# Patient Record
Sex: Female | Born: 1970 | Race: Black or African American | Hispanic: No | Marital: Married | Smoking: Never smoker
Health system: Southern US, Community
[De-identification: ages and names within clinical notes are randomized; demographics above are authoritative.]

## PROBLEM LIST (undated history)

## (undated) DIAGNOSIS — I499 Cardiac arrhythmia, unspecified: Secondary | ICD-10-CM

## (undated) DIAGNOSIS — E669 Obesity, unspecified: Secondary | ICD-10-CM

## (undated) DIAGNOSIS — R011 Cardiac murmur, unspecified: Secondary | ICD-10-CM

## (undated) DIAGNOSIS — D259 Leiomyoma of uterus, unspecified: Secondary | ICD-10-CM

## (undated) DIAGNOSIS — F419 Anxiety disorder, unspecified: Secondary | ICD-10-CM

## (undated) DIAGNOSIS — O09219 Supervision of pregnancy with history of pre-term labor, unspecified trimester: Secondary | ICD-10-CM

## (undated) DIAGNOSIS — M1990 Unspecified osteoarthritis, unspecified site: Secondary | ICD-10-CM

## (undated) DIAGNOSIS — K219 Gastro-esophageal reflux disease without esophagitis: Secondary | ICD-10-CM

## (undated) DIAGNOSIS — J329 Chronic sinusitis, unspecified: Principal | ICD-10-CM

## (undated) DIAGNOSIS — O09529 Supervision of elderly multigravida, unspecified trimester: Secondary | ICD-10-CM

## (undated) DIAGNOSIS — M461 Sacroiliitis, not elsewhere classified: Secondary | ICD-10-CM

## (undated) DIAGNOSIS — Z8619 Personal history of other infectious and parasitic diseases: Secondary | ICD-10-CM

## (undated) DIAGNOSIS — R12 Heartburn: Secondary | ICD-10-CM

## (undated) DIAGNOSIS — N83209 Unspecified ovarian cyst, unspecified side: Secondary | ICD-10-CM

## (undated) DIAGNOSIS — R51 Headache: Secondary | ICD-10-CM

## (undated) DIAGNOSIS — F909 Attention-deficit hyperactivity disorder, unspecified type: Secondary | ICD-10-CM

## (undated) DIAGNOSIS — M069 Rheumatoid arthritis, unspecified: Secondary | ICD-10-CM

## (undated) DIAGNOSIS — O210 Mild hyperemesis gravidarum: Secondary | ICD-10-CM

## (undated) HISTORY — DX: Obesity, unspecified: E66.9

## (undated) HISTORY — DX: Supervision of pregnancy with history of pre-term labor, unspecified trimester: O09.219

## (undated) HISTORY — DX: Leiomyoma of uterus, unspecified: D25.9

## (undated) HISTORY — DX: Headache: R51

## (undated) HISTORY — DX: Chronic sinusitis, unspecified: J32.9

## (undated) HISTORY — DX: Supervision of elderly multigravida, unspecified trimester: O09.529

## (undated) HISTORY — PX: OTHER SURGICAL HISTORY: SHX169

## (undated) HISTORY — PX: TONSILLECTOMY AND ADENOIDECTOMY: SHX28

## (undated) HISTORY — DX: Rheumatoid arthritis, unspecified: M06.9

## (undated) HISTORY — DX: Heartburn: R12

## (undated) HISTORY — PX: TONSILLECTOMY: SUR1361

## (undated) HISTORY — PX: LAPAROSCOPY: SHX197

## (undated) HISTORY — DX: Personal history of other infectious and parasitic diseases: Z86.19

## (undated) HISTORY — DX: Sacroiliitis, not elsewhere classified: M46.1

## (undated) HISTORY — DX: Cardiac arrhythmia, unspecified: I49.9

---

## 1997-12-29 ENCOUNTER — Emergency Department (HOSPITAL_COMMUNITY): Admission: EM | Admit: 1997-12-29 | Discharge: 1997-12-29 | Payer: Self-pay | Admitting: Emergency Medicine

## 1998-01-13 ENCOUNTER — Inpatient Hospital Stay (HOSPITAL_COMMUNITY): Admission: AD | Admit: 1998-01-13 | Discharge: 1998-01-13 | Payer: Self-pay | Admitting: Obstetrics and Gynecology

## 1999-01-06 ENCOUNTER — Encounter: Payer: Self-pay | Admitting: Emergency Medicine

## 1999-01-06 ENCOUNTER — Emergency Department (HOSPITAL_COMMUNITY): Admission: EM | Admit: 1999-01-06 | Discharge: 1999-01-06 | Payer: Self-pay | Admitting: Emergency Medicine

## 1999-03-25 ENCOUNTER — Other Ambulatory Visit: Admission: RE | Admit: 1999-03-25 | Discharge: 1999-03-25 | Payer: Self-pay | Admitting: Obstetrics & Gynecology

## 1999-04-23 ENCOUNTER — Emergency Department (HOSPITAL_COMMUNITY): Admission: EM | Admit: 1999-04-23 | Discharge: 1999-04-23 | Payer: Self-pay | Admitting: Emergency Medicine

## 1999-04-23 ENCOUNTER — Encounter: Payer: Self-pay | Admitting: Emergency Medicine

## 1999-05-31 ENCOUNTER — Emergency Department (HOSPITAL_COMMUNITY): Admission: EM | Admit: 1999-05-31 | Discharge: 1999-05-31 | Payer: Self-pay | Admitting: Emergency Medicine

## 1999-06-06 ENCOUNTER — Emergency Department (HOSPITAL_COMMUNITY): Admission: EM | Admit: 1999-06-06 | Discharge: 1999-06-06 | Payer: Self-pay | Admitting: Emergency Medicine

## 1999-07-21 ENCOUNTER — Encounter: Payer: Self-pay | Admitting: Obstetrics and Gynecology

## 1999-07-21 ENCOUNTER — Ambulatory Visit (HOSPITAL_COMMUNITY): Admission: RE | Admit: 1999-07-21 | Discharge: 1999-07-21 | Payer: Self-pay | Admitting: Obstetrics and Gynecology

## 2002-12-25 ENCOUNTER — Inpatient Hospital Stay (HOSPITAL_COMMUNITY): Admission: AD | Admit: 2002-12-25 | Discharge: 2003-01-01 | Payer: Self-pay | Admitting: Obstetrics & Gynecology

## 2002-12-26 ENCOUNTER — Encounter: Payer: Self-pay | Admitting: *Deleted

## 2003-01-08 ENCOUNTER — Encounter: Admission: RE | Admit: 2003-01-08 | Discharge: 2003-01-08 | Payer: Self-pay | Admitting: *Deleted

## 2003-01-15 ENCOUNTER — Encounter: Admission: RE | Admit: 2003-01-15 | Discharge: 2003-01-15 | Payer: Self-pay | Admitting: Family Medicine

## 2003-01-15 ENCOUNTER — Inpatient Hospital Stay (HOSPITAL_COMMUNITY): Admission: AD | Admit: 2003-01-15 | Discharge: 2003-01-15 | Payer: Self-pay | Admitting: Obstetrics & Gynecology

## 2003-01-29 ENCOUNTER — Encounter: Admission: RE | Admit: 2003-01-29 | Discharge: 2003-01-29 | Payer: Self-pay | Admitting: Family Medicine

## 2003-01-29 ENCOUNTER — Inpatient Hospital Stay (HOSPITAL_COMMUNITY): Admission: AD | Admit: 2003-01-29 | Discharge: 2003-01-29 | Payer: Self-pay | Admitting: Obstetrics & Gynecology

## 2003-02-12 ENCOUNTER — Encounter: Admission: RE | Admit: 2003-02-12 | Discharge: 2003-02-12 | Payer: Self-pay | Admitting: *Deleted

## 2003-02-26 ENCOUNTER — Encounter: Admission: RE | Admit: 2003-02-26 | Discharge: 2003-02-26 | Payer: Self-pay | Admitting: Family Medicine

## 2003-03-12 ENCOUNTER — Inpatient Hospital Stay (HOSPITAL_COMMUNITY): Admission: AD | Admit: 2003-03-12 | Discharge: 2003-03-19 | Payer: Self-pay | Admitting: Obstetrics & Gynecology

## 2003-03-12 ENCOUNTER — Encounter: Admission: RE | Admit: 2003-03-12 | Discharge: 2003-03-12 | Payer: Self-pay | Admitting: *Deleted

## 2003-03-12 ENCOUNTER — Encounter: Payer: Self-pay | Admitting: Obstetrics & Gynecology

## 2003-03-15 ENCOUNTER — Encounter: Payer: Self-pay | Admitting: Family Medicine

## 2003-03-16 ENCOUNTER — Encounter: Payer: Self-pay | Admitting: *Deleted

## 2003-03-17 ENCOUNTER — Encounter: Payer: Self-pay | Admitting: Cardiology

## 2003-03-23 ENCOUNTER — Observation Stay (HOSPITAL_COMMUNITY): Admission: AD | Admit: 2003-03-23 | Discharge: 2003-03-24 | Payer: Self-pay | Admitting: Obstetrics & Gynecology

## 2004-12-19 ENCOUNTER — Inpatient Hospital Stay (HOSPITAL_COMMUNITY): Admission: AD | Admit: 2004-12-19 | Discharge: 2004-12-19 | Payer: Self-pay | Admitting: Obstetrics and Gynecology

## 2004-12-21 ENCOUNTER — Inpatient Hospital Stay (HOSPITAL_COMMUNITY): Admission: AD | Admit: 2004-12-21 | Discharge: 2004-12-23 | Payer: Self-pay | Admitting: Obstetrics and Gynecology

## 2004-12-21 ENCOUNTER — Encounter (INDEPENDENT_AMBULATORY_CARE_PROVIDER_SITE_OTHER): Payer: Self-pay | Admitting: *Deleted

## 2004-12-24 ENCOUNTER — Encounter: Admission: RE | Admit: 2004-12-24 | Discharge: 2005-01-20 | Payer: Self-pay | Admitting: Obstetrics and Gynecology

## 2007-06-08 ENCOUNTER — Inpatient Hospital Stay (HOSPITAL_COMMUNITY): Admission: AD | Admit: 2007-06-08 | Discharge: 2007-06-09 | Payer: Self-pay | Admitting: Obstetrics

## 2007-11-08 ENCOUNTER — Ambulatory Visit (HOSPITAL_COMMUNITY): Admission: RE | Admit: 2007-11-08 | Discharge: 2007-11-08 | Payer: Self-pay | Admitting: Gastroenterology

## 2007-12-31 LAB — CONVERTED CEMR LAB: Pap Smear: NORMAL

## 2008-02-03 ENCOUNTER — Ambulatory Visit (HOSPITAL_COMMUNITY): Admission: RE | Admit: 2008-02-03 | Discharge: 2008-02-03 | Payer: Self-pay | Admitting: Obstetrics and Gynecology

## 2008-07-16 ENCOUNTER — Ambulatory Visit: Payer: Self-pay | Admitting: Internal Medicine

## 2008-07-16 ENCOUNTER — Ambulatory Visit: Payer: Self-pay | Admitting: Diagnostic Radiology

## 2008-07-16 ENCOUNTER — Ambulatory Visit (HOSPITAL_BASED_OUTPATIENT_CLINIC_OR_DEPARTMENT_OTHER): Admission: RE | Admit: 2008-07-16 | Discharge: 2008-07-16 | Payer: Self-pay | Admitting: Internal Medicine

## 2008-07-16 ENCOUNTER — Telehealth: Payer: Self-pay | Admitting: Internal Medicine

## 2008-07-16 DIAGNOSIS — Z8719 Personal history of other diseases of the digestive system: Secondary | ICD-10-CM | POA: Insufficient documentation

## 2008-07-16 DIAGNOSIS — M25569 Pain in unspecified knee: Secondary | ICD-10-CM | POA: Insufficient documentation

## 2008-07-17 ENCOUNTER — Ambulatory Visit: Payer: Self-pay | Admitting: Internal Medicine

## 2008-07-17 LAB — CONVERTED CEMR LAB
ALT: 17 units/L (ref 0–35)
AST: 17 units/L (ref 0–37)
Alkaline Phosphatase: 48 units/L (ref 39–117)
BUN: 14 mg/dL (ref 6–23)
Bilirubin Urine: NEGATIVE
Bilirubin, Direct: 0.1 mg/dL (ref 0.0–0.3)
Calcium: 9.3 mg/dL (ref 8.4–10.5)
Cholesterol: 189 mg/dL (ref 0–200)
Eosinophils Absolute: 0.1 10*3/uL (ref 0.0–0.7)
GFR calc Af Amer: 105 mL/min
GFR calc non Af Amer: 87 mL/min
Glucose, Bld: 84 mg/dL (ref 70–99)
HCT: 37.4 % (ref 36.0–46.0)
HDL: 73.2 mg/dL (ref >39.0)
Hemoglobin: 13 g/dL (ref 12.0–15.0)
Ketones, ur: NEGATIVE mg/dL
MCV: 95.3 fL (ref 78.0–100.0)
Monocytes Absolute: 0.6 10*3/uL (ref 0.1–1.0)
Monocytes Relative: 6.8 % (ref 3.0–12.0)
Neutro Abs: 5.7 10*3/uL (ref 1.4–7.7)
Nitrite: NEGATIVE
RDW: 12.5 % (ref 11.5–14.6)
Sodium: 140 meq/L (ref 135–145)
TSH: 1.1 microintl units/mL (ref 0.35–5.50)
Total Protein, Urine: NEGATIVE mg/dL
Triglycerides: 45 mg/dL (ref 0–149)
Urine Glucose: NEGATIVE mg/dL
VLDL: 9 mg/dL (ref 0–40)
pH: 5.5 (ref 5.0–8.0)

## 2008-07-20 ENCOUNTER — Encounter: Payer: Self-pay | Admitting: Internal Medicine

## 2010-01-25 ENCOUNTER — Ambulatory Visit: Payer: Self-pay | Admitting: Internal Medicine

## 2010-01-25 ENCOUNTER — Telehealth: Payer: Self-pay | Admitting: Internal Medicine

## 2010-01-25 DIAGNOSIS — M549 Dorsalgia, unspecified: Secondary | ICD-10-CM | POA: Insufficient documentation

## 2010-01-25 LAB — CONVERTED CEMR LAB
Blood in Urine, dipstick: NEGATIVE
Ketones, urine, test strip: NEGATIVE
Nitrite: NEGATIVE
Protein, U semiquant: NEGATIVE
Urobilinogen, UA: 0.2
WBC Urine, dipstick: NEGATIVE

## 2010-03-29 LAB — CONVERTED CEMR LAB: Pap Smear: NORMAL

## 2010-06-01 ENCOUNTER — Ambulatory Visit (HOSPITAL_BASED_OUTPATIENT_CLINIC_OR_DEPARTMENT_OTHER): Admission: RE | Admit: 2010-06-01 | Discharge: 2010-06-01 | Payer: Self-pay | Admitting: Internal Medicine

## 2010-06-01 ENCOUNTER — Ambulatory Visit: Payer: Self-pay | Admitting: Internal Medicine

## 2010-06-01 ENCOUNTER — Ambulatory Visit: Payer: Self-pay | Admitting: Diagnostic Radiology

## 2010-06-01 DIAGNOSIS — R10819 Abdominal tenderness, unspecified site: Secondary | ICD-10-CM | POA: Insufficient documentation

## 2010-06-01 LAB — CONVERTED CEMR LAB
ALT: 15 units/L (ref 0–35)
Albumin: 4.4 g/dL (ref 3.5–5.2)
Alkaline Phosphatase: 45 units/L (ref 39–117)
Bilirubin Urine: NEGATIVE
CO2: 27 meq/L (ref 19–32)
Calcium: 9.6 mg/dL (ref 8.4–10.5)
Chloride: 103 meq/L (ref 96–112)
Creatinine, Ser: 0.92 mg/dL (ref 0.40–1.20)
Eosinophils Absolute: 0.1 10*3/uL (ref 0.0–0.7)
Eosinophils Relative: 1 % (ref 0–5)
Glucose, Bld: 77 mg/dL (ref 70–99)
Glucose, Urine, Semiquant: NEGATIVE
HCT: 35.9 % — ABNORMAL LOW (ref 36.0–46.0)
Indirect Bilirubin: 0.5 mg/dL (ref 0.0–0.9)
Ketones, urine, test strip: NEGATIVE
Lymphocytes Relative: 42 % (ref 12–46)
Lymphs Abs: 2.8 10*3/uL (ref 0.7–4.0)
MCV: 93 fL (ref 78.0–100.0)
Monocytes Relative: 8 % (ref 3–12)
RBC: 3.86 M/uL — ABNORMAL LOW (ref 3.87–5.11)
Sed Rate: 7 mm/hr (ref 0–22)
Sodium: 138 meq/L (ref 135–145)
Total Protein: 6.6 g/dL (ref 6.0–8.3)
WBC: 6.8 10*3/uL (ref 4.0–10.5)
pH: 8.5

## 2010-06-06 ENCOUNTER — Telehealth: Payer: Self-pay | Admitting: Internal Medicine

## 2010-06-07 ENCOUNTER — Ambulatory Visit: Payer: Self-pay | Admitting: Internal Medicine

## 2010-06-07 DIAGNOSIS — N72 Inflammatory disease of cervix uteri: Secondary | ICD-10-CM | POA: Insufficient documentation

## 2010-07-30 ENCOUNTER — Encounter: Payer: Self-pay | Admitting: Obstetrics and Gynecology

## 2010-07-31 ENCOUNTER — Encounter: Payer: Self-pay | Admitting: Obstetrics and Gynecology

## 2010-07-31 ENCOUNTER — Encounter: Payer: Self-pay | Admitting: Gastroenterology

## 2010-08-09 NOTE — Progress Notes (Signed)
Summary: drug not available   Phone Note Call from Patient Call back at [phone]   Caller: pt Call For: [name]  Summary of Call: metayalone 800 mg is on backorder at the drugstore.  Could you call in something else to [facility]  Initial call taken by: Roselle Locus,  January 25, 2010 2:01 PM  Follow-up for Phone Call        patient advised of medication change, and to watch for dizziness Follow-up by: Glendell Docker CMA,  January 26, 2010 12:24 PM    New/Updated Medications: CYCLOBENZAPRINE HCL 5 MG TABS (CYCLOBENZAPRINE HCL) one by mouth three times a day prn Prescriptions: CYCLOBENZAPRINE HCL 5 MG TABS (CYCLOBENZAPRINE HCL) one by mouth three times a day prn  #30 x 0   Entered and Authorized by:   D. Thomos Lemons DO   Signed by:   D. Thomos Lemons DO on 01/26/2010   Method used:   Electronically to        [facility] (retail)       [address]       [place], [place]  [address]       Ph: [phone]       Fax: [phone]   RxID:   [id number]

## 2010-08-09 NOTE — Assessment & Plan Note (Signed)
Summary: FOLLOW UP/DK   Vital Signs:  Patient profile:   40 year old female O2 Sat:      100 % on Room air Temp:     98.4 degrees F oral Pulse rate:   65 / minute Resp:     16 per minute BP sitting:   112 / 80  (left arm) Cuff size:   regular  Vitals Entered By: Glendell Docker CMA (June 07, 2010 11:06 AM)  O2 Flow:  Room air CC: follow-up visit Is Patient Diabetic? No Pain Assessment Patient in pain? no       Does patient need assistance? Functional Status Shopping Comments follow up on CT results, pain is still present, but some improvement, lower back pain   Primary Care Provider:  Dondra Spry DO  CC:  follow-up visit.  History of Present Illness: 40 y/o AA female for f/u pt did not start abx as prescribed some improvment in pelvic pain and rectal discomfort we reviewed results of CT of abd and pelvis  Allergies (verified): No Known Drug Allergies  Past History:  Past Medical History: History of GERD History of ovarian cyst     Past Surgical History: Tonsillectomy 1993 Ovarian cyst surgery 2009      Physical Exam  General:  alert, well-developed, and well-nourished.   Lungs:  normal respiratory effort and normal breath sounds.   Heart:  normal rate, regular rhythm, and no gallop.   Abdomen:  lower abd tenderness,  no rebound or guarding   Impression & Recommendations:  Problem # 1:  CERVICITIS, ACUTE (ICD-616.0) Assessment New signs and syptoms c/w PID.  empiric abx CT of abd and pelvis suggest cervicitis f/u GYN  Complete Medication List: 1)  Metaxalone 800 Mg Tabs (Metaxalone) .... One by mouth three times a day prn 2)  Cyclobenzaprine Hcl 5 Mg Tabs (Cyclobenzaprine hcl) .... One by mouth three times a day prn 3)  Tramadol Hcl 50 Mg Tabs (Tramadol hcl) .... One tablet every 4 hours as needed pain 4)  Azithromycin 250 Mg Tabs (Azithromycin) .... 4 tabs x 1  Other Orders: Admin of Therapeutic Inj  intramuscular or subcutaneous  ([address]) Rocephin  250mg  (U0454)  Patient Instructions: 1)  The highlighted prescriptions were electronically sent to your pharmacy 2)  Please follow up with your GYN within 2 weeks. 3)  Call our office if your symptoms do not  improve or gets worse. Prescriptions: TRAMADOL HCL 50 MG TABS (TRAMADOL HCL) one tablet every 4 hours as needed pain  #30 x 0   Entered by:   Glendell Docker CMA   Authorized by:   D. Thomos Lemons DO   Signed by:   Glendell Docker CMA on 06/07/2010   Method used:   Electronically to        [facility]* (retail)       [address] [place], [place]  [address]       Ph: [phone]       Fax: [phone]   RxID:   [id number] AZITHROMYCIN 250 MG TABS (AZITHROMYCIN) 4 tabs x 1  #4 x 0   Entered and Authorized by:   D. Thomos Lemons DO   Signed by:   D. Thomos Lemons DO on 06/07/2010   Method used:   Electronically to        [facility]* (retail)       [phone]  [place], [place]  [address]       Ph: [phone]       Fax: [phone]   RxID:   [id number]    Medication Administration  Injection # 1:    Medication: Rocephin  250mg     Diagnosis: ABDOMINAL TENDERNESS UNSPECIFIED SITE (ICD-789.60)    Route: IM    Site: RUOQ gluteus    Exp Date: 04/08/2012    Lot #: [id number]    Mfr: [employer]    Patient tolerated injection without complications    Given by: Mervin Kung CMA Duncan Dull) (June 07, 2010 1:32 PM)  Orders Added: 1)  Admin of Therapeutic Inj  intramuscular or subcutaneous [96372] 2)  Rocephin  250mg  [J0696] 3)  Est. Patient Level III [40102]    Current Allergies (reviewed today): No known allergies

## 2010-08-09 NOTE — Assessment & Plan Note (Signed)
Summary: stomach and rectum pain/mhf   Vital Signs:  Patient profile:   40 year old female Height:      64 inches Weight:      157 pounds BMI:     27.05 O2 Sat:      98 % on Room air Temp:     98.2 degrees F oral Pulse rate:   68 / minute Resp:     16 per minute BP sitting:   102 / 72  (right arm) Cuff size:   regular  Vitals Entered By: Glendell Docker CMA (June 01, 2010 11:20 AM)  O2 Flow:  Room air CC: abdominal pain Is Patient Diabetic? No Pain Assessment Patient in pain? yes     Location: abdomen Intensity: 9 Type: sharp/Pulling Tearing senstation Onset of pain  Constant- increases with activity Comments c/o sudden onset abdominal pain with rectal pressure and pain in rectal area, nausea no vomiting, onset Sunday, seen at GYN yesterday ovarian cyst  pregnancy test and urine test  all were negative, c/o urinary frequency and urgency, denies temperature   Primary Care Provider:  Dondra Spry DO  CC:  abdominal pain.  History of Present Illness: 40 y/o AA female c/o pelvic and rectal pain onset sunday pain with sexual intercourse pelvic pain worse with movement that night - pt had diarrhea afterwards experienced rectal  no unusual food intake   seen by GYN -  ovarian u/s normal CBC normal,  no pelvic infection started on tramadol  no fever or chills pressure with urinating  no prev hx of pelvic infection no hx of colitis  no change in bowel habits  painful with passing gas  Preventive Screening-Counseling & Management  Alcohol-Tobacco     Smoking Status: never  Allergies (verified): No Known Drug Allergies  Past History:  Past Medical History: History of GERD History of ovarian cyst     Past Surgical History: Tonsillectomy 1993 Ovarian cyst surgery 2009      Family History: Diabetes mellitus type II - mother, father Renal failure secondary to diabetes-father Breast cancer-no Colon cancer-no      Social History: 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  The patient denies hematochezia and severe indigestion/heartburn.    Physical Exam  General:  alert, well-developed, and well-nourished.   Head:  normocephalic and atraumatic.   Mouth:  pharynx pink and moist.   Neck:  No deformities, masses, or tenderness noted. Lungs:  normal respiratory effort and normal breath sounds.   Heart:  normal rate, regular rhythm, and no gallop.   Abdomen:  mid abd tenderness, soft, no guarding, no rigidity, no hepatomegaly, and no splenomegaly.   Rectal:  no external abnormalities and no masses.  rectal pain with exam Extremities:  No lower extremity edema   Impression & Recommendations:  Problem # 1:  ABDOMINAL TENDERNESS UNSPECIFIED SITE (ICD-789.60) pelvis pain of unclear etiology.  check CT of abd.  rule out intra abd abscess GYN eval reported normal.  empiric cipro and flagyl Patient advised to call office if symptoms persist or worsen.  Orders: T-Basic Metabolic Panel [id number]) T-CBC w/Diff [phone]) T-Hepatic Function ([phone]) T-Sed Rate (Automated) [id number]) CT with Contrast (CT w/ contrast) UA Dipstick w/o Micro (manual) ([address]) Specimen Handling ([address]) T-Culture, Urine ([id number])  Complete Medication List: 1)  Metaxalone  800 Mg Tabs (Metaxalone) .... One by mouth three times a day prn 2)  Cyclobenzaprine Hcl 5 Mg Tabs (Cyclobenzaprine hcl) .... One by mouth three times a day prn 3)  Tramadol Hcl 50 Mg Tabs (Tramadol hcl) .... One tablet every 4 hours as needed pain 4)  Ciprofloxacin Hcl 500 Mg Tabs (Ciprofloxacin hcl) .... One by mouth two times a day 5)  Metronidazole 500 Mg Tabs (Metronidazole) .... One by mouth three times a day  Patient Instructions: 1)  Go to the ER if your abdominal symptoms get  worse. Prescriptions: METRONIDAZOLE 500 MG TABS (METRONIDAZOLE) one by mouth three times a day  #30 x 0   Entered and Authorized by:   D. Thomos Lemons DO   Signed by:   D. Thomos Lemons DO on 06/01/2010   Method used:   Electronically to        [facility] (retail)       [address]       [place], [place]  [address]       Ph: [phone]       Fax: [phone]   RxID:   [id number] CIPROFLOXACIN HCL 500 MG TABS (CIPROFLOXACIN HCL) one by mouth two times a day  #20 x 0   Entered and Authorized by:   D. Thomos Lemons DO   Signed by:   D. Thomos Lemons DO on 06/01/2010   Method used:   Electronically to        [facility] (retail)       [address]       [place], [place]  [address]       Ph: [phone]       Fax: [phone]   RxID:   [id number]  Rx's cancelled to to Karin Golden spoke with Michele Mcalpine, rxs telephone to [facility] on [address] per patient request Glendell Docker [facility]  June 01, 2010 12:06 PM   Orders Added: 1)  T-Basic Metabolic Panel [phone] 2)  T-CBC w/Diff [[id number]] 3)  T-Hepatic Function [[id number]] 4)  T-Sed Rate (Automated) [[id number]] 5)  CT with Contrast [CT w/ contrast] 6)  UA Dipstick w/o Micro (manual) [81002] 7)  Specimen Handling [99000] 8)  T-Culture, Urine [[id number]] 9)  Est. Patient Level IV [[address]]   Immunization History:  Influenza Immunization History:    Influenza:  declined (06/01/2010)   Contraindications/Deferment of Procedures/Staging:    Test/Procedure: FLU VAX    Reason for deferment: patient declined   Immunization History:  Influenza Immunization History:    Influenza:  Declined (06/01/2010)  Current Allergies (reviewed today): No known allergies     Preventive Care Screening  Pap Smear:    Date:  03/29/2010    Results:  normal      Laboratory Results   Urine Tests    Routine Urinalysis   Color:  yellow Appearance: Clear Glucose: negative   (Normal Range: Negative) Bilirubin: negative   (Normal Range: Negative) Ketone: negative   (Normal Range: Negative) Spec. Gravity: <1.005   (Normal Range: 1.003-1.035) Blood: trace-lysed   (Normal Range: Negative) pH: 8.5   (Normal Range: 5.0-8.0) Protein: trace   (Normal Range: Negative) Urobilinogen: 0.2   (Normal Range: 0-1) Nitrite: negative   (Normal Range: Negative) Leukocyte Esterace: negative   (Normal Range: Negative)

## 2010-08-09 NOTE — Assessment & Plan Note (Signed)
Summary: back pain/mhf   Vital Signs:  Patient profile:   40 year old female Weight:      166 pounds BMI:     28.60 O2 Sat:      98 % on Room air Temp:     98.1 degrees F oral Pulse rate:   86 / minute Pulse rhythm:   regular Resp:     16 per minute BP sitting:   110 / 70  (right arm) Cuff size:   regular  Vitals Entered By: Glendell Docker CMA (January 25, 2010 10:47 AM)  O2 Flow:  Room air CC: Rm 3- Back Pain Is Patient Diabetic? No Pain Assessment Patient in pain? yes     Location: lower back Intensity: 10 Type: aching Onset of pain  Constant   Primary Care Provider:  Dondra Spry DO  CC:  Rm 3- Back Pain.  History of Present Illness: 40 y/o female c/o low back symptoms started yesterday when she woke up   she does not remember injuring herself Acetaminophen with no relief no urinary symptoms   Preventive Screening-Counseling & Management  Alcohol-Tobacco     Smoking Status: never  Allergies (verified): No Known Drug Allergies  Past History:  Past Medical History: History of GERD History of ovarian cyst    Past Surgical History: Tonsillectomy 1993 Ovarian cyst surgery 2009     Family History: Diabetes mellitus type II - mother, father Renal failure secondary to diabetes-father Breast cancer-no Colon cancer-no     Social History: 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   Lungs:  normal respiratory effort and normal breath sounds.   Heart:  normal rate, regular rhythm, and no gallop.   Extremities:  No lower extremity edema  Neurologic:  cranial nerves II-XII intact, strength normal in all extremities, and DTRs symmetrical and normal.     Impression & Recommendations:  Problem # 1:  BACK PAIN (ICD-724.5) Lumbar strain.  ua normal.  use otc nsaids and muscle relaxer.  Patient  advised to call office if symptoms persist or worsen.  Her updated medication list for this problem includes:    Metaxalone 800 Mg Tabs (Metaxalone) ..... One by mouth three times a day prn    Cyclobenzaprine Hcl 5 Mg Tabs (Cyclobenzaprine hcl) ..... One by mouth three times a day prn  Orders: UA Dipstick w/o Micro (manual) ([address])  Complete Medication List: 1)  Metaxalone 800 Mg Tabs (Metaxalone) .... One by mouth three times a day prn 2)  Cyclobenzaprine Hcl 5 Mg Tabs (Cyclobenzaprine hcl) .... One by mouth three times a day prn  Patient Instructions: 1)  Take ibuprofen 600 mg by mouth two times a day as needed (with food) 2)  Call our office if your symptoms do not  improve or gets worse. Prescriptions: METAXALONE 800 MG TABS (METAXALONE) one by mouth three times a day prn  #30 x 0   Entered and Authorized by:   D. Thomos Lemons DO   Signed by:   D. Thomos Lemons DO on 01/25/2010   Method used:   Electronically to        [facility] (retail)       [address]       [place], [place]  [address]  Ph: [phone]       Fax: [phone]   RxID:   [id number]   Current Allergies (reviewed today): No known allergies     Laboratory Results   Urine Tests    Routine Urinalysis   Color: yellow Appearance: Clear Glucose: negative   (Normal Range: Negative) Bilirubin: negative   (Normal Range: Negative) Ketone: negative   (Normal Range: Negative) Spec. Gravity: 1.025   (Normal Range: 1.003-1.035) Blood: negative   (Normal Range: Negative) pH: >=9.0   (Normal Range: 5.0-8.0) Protein: negative   (Normal Range: Negative) Urobilinogen: 0.2   (Normal Range: 0-1) Nitrite: negative   (Normal Range: Negative) Leukocyte Esterace: negative   (Normal Range: Negative)

## 2010-08-09 NOTE — Progress Notes (Signed)
Summary: Test Results  Phone Note Outgoing Call   Summary of Call: call pt - blood was normal.  CT of abd and pelvis showed cervical inflammation.  I suggest f/u OV this week.   If she is having any residual symptoms, pt should come in earlier for additional antibiotic ASAP Initial call taken by: D. Thomos Lemons DO,  June 06, 2010 12:42 PM  Follow-up for Phone Call        call placed to patient at [phone], patients husband Trey Paula stated patient was not available. He has been advised per Dr Artist Pais instructions, and  to have patient call to schedule follow up with Dr Artist Pais this week.  Trey Paula verbalized understanding and will have patient call back if there are any questions Follow-up by: Glendell Docker CMA,  June 06, 2010 1:27 PM

## 2010-11-22 NOTE — Op Note (Signed)
NAME:  [name], [name]          ACCOUNT NO.:  [id number]   MEDICAL RECORD NO.:  [id number]          PATIENT TYPE:  AMB   LOCATION:  SDC                           FACILITY:  [facility]   PHYSICIAN:  IT trainer, M.D.DATE OF BIRTH:  1970/08/31   DATE OF PROCEDURE:  02/03/2008  DATE OF DISCHARGE:                               OPERATIVE REPORT   PREOPERATIVE DIAGNOSES:  Right lower quadrant pain, right ovarian cyst.   PROCEDURES:  Diagnostic laparoscopy, lysis of adhesions.   POSTOPERATIVE DIAGNOSES:  Right lower quadrant pain, pelvic adhesions.   ANESTHESIA:  General.   SURGEON:  Maxie Better, MD   ASSISTANT:  None.   PROCEDURE:  Under adequate general anesthesia, the patient was placed in  the dorsal lithotomy position.  Examination under anesthesia revealed  anteverted uterus, mobile.  No adnexal masses could be appreciated.  The  patient was sterilely prepped and draped in usual fashion.  The bivalve  speculum was placed in the vagina.  A single-tooth tenaculum was placed  in the anterior lip of the cervix.  An Acorn cannula was introduced into  the cervical os and attached to the tenaculum for the manipulation of  uterus.  The bivalve speculum was then removed.  Attention was then  turned to the abdomen.  Marcaine 0.25% was injected infraumbilically.  An infraumbilical incision was then made.  A Veress needle was  introduced, tested with normal saline.  Opening pressure of 3 was noted.  2.1 L of CO2 was insufflated.  A Veress needle was then removed.  A  disposable 10-mm trocar was introduced into the abdomen without  incident.  The lighted video laparoscope was placed through that port  and a panoramic inspection of the abdomen and pelvis was performed.  Upper abdomen showed normal liver edge, normal gallbladder that was  seen, normal appendix.  A suprapubic incision was then made and a 5-mm  port was placed under direct visualization.  The pelvis was then  inspected.  The uterus was normal and mobile.  Both ovaries were normal.  The right ovary had a corpus luteal cyst, was otherwise not small.  There was nothing else present.  There were some filmy adhesions in the  posterior cul-de-sac which was subsequently lysed through the  infraumbilical cord port using scissors.  Some serous fluid was noted in  the posterior cul-de-sac and this was aspirated.  Otherwise, the  procedure was with no other findings, no evidence of endometriosis in  the anterior and posterior cul-de-sacs.  With that in mind, the ports  were removed under direct visualization.  The abdomen was deflated and  the sites closed with Dermabond.  The instruments from the vagina were  then removed.  Specimen was none.  Estimated blood loss was minimal.  Complications were none.  The patient tolerated the procedure well and  was transferred to recovery room in stable condition.     Maxie Better, M.D.  Electronically Signed    [place]/MEDQ  D:  02/03/2008  T:  02/04/2008  Job:  [id number]

## 2010-11-25 NOTE — Discharge Summary (Signed)
[name], [name]                            ACCOUNT NO.:  [id number]   MEDICAL RECORD NO.:  [id number]                   PATIENT TYPE:  INP   LOCATION:  9148                                 FACILITY:  [facility]   PHYSICIAN:  Lesly Dukes, M.D.              DATE OF BIRTH:  Aug 26, 1970   DATE OF ADMISSION:  03/12/2003  DATE OF DISCHARGE:  03/19/2003                                 DISCHARGE SUMMARY   HOSPITAL COURSE:  This patient is a 40 year old G2 P1-0-0-1 who presented to  high risk clinic for scheduled visit on March 12, 2003 and was found to  have increased blood pressure.  She was at 50 and two-sevenths weeks  estimated gestational age by her last normal menstrual period.  The patient  also complained of nausea and vomiting x4 days and headache and seeing white  spots x1 day.  She was admitted with the diagnosis of severe preeclampsia  for induction of labor, was maintained on magnesium sulfate, and had  preeclamptic labs which showed uric acid 4.0, AST 66, ALT 125, sodium 137,  potassium 3.8, chloride 11, bicarb 22, BUN 9, creatinine 0.8, glucose 52.  The patient eventually progressed to a normal spontaneous vaginal delivery  of a viable female, Apgars of 7 and 9, nuchal cord x1.  Placenta was  delivered spontaneously, intact, three-vessel cord.  Postpartum the patient  was maintained on magnesium sulfate secondary to persistently elevated blood  pressures.  She retained a large amount of fluid, especially in her lower  extremities.  Chest x-ray was performed to rule out pulmonary edema and the  patient was found to have some vascular congestion with small effusions.  Cardiology was consulted at the patient was also found to have cardiomegaly  on exam.  The patient had 2-D echocardiogram which showed 65% ejection  fraction.  The patient's cardiac enzymes were within normal limits.  BNP was  within normal limits.  Repeat echocardiogram was recommended by cardiology  in three  to six months.  Lasix was eventually given for the patient's fluid  retention and on postpartum day #5 the patient was observed to have a 10-  pound weight loss following fluid restriction.  Mom was breastfeeding  without difficulty, using Micronor for temporary birth control, and was to  scheduled a bilateral tubal ligation at six-week follow-up.  The patient was  started on hydrochlorothiazide 25 mg p.o. one daily prior to discharge.   Discharge laboratory data showed sodium 135, potassium 4.2, chloride 109,  bicarb 23, BUN 11, creatinine 0.8, glucose 59, calcium 8.6.  The patient  with negative fluid balance.  Hemoglobin 9.4, hematocrit 27.4 at discharge.   The patient given prescription for:  1. Ibuprofen 600 one q.6h. p.r.n. for pain.  2. Prenatal vitamins for six weeks or as long as breastfeeding.  3. Iron sulfate one 325 mg p.o. daily.  4. Micronor for birth control.  Franklyn Lor, MD                            Lesly Dukes, M.D.    TD/MEDQ  D:  05/25/2003  T:  05/25/2003  Job:  [id number]

## 2010-11-25 NOTE — Discharge Summary (Signed)
NAMEANNAKATE, [name] NO.:  [id number]   MEDICAL RECORD NO.:  [id number]                   PATIENT TYPE:  INP   LOCATION:  9158                                 [facility]ILITY:  [facility]   PHYSICIAN:  Burnadette Peter, M.D.             DATE OF BIRTH:  Apr 02, 1971   DATE OF ADMISSION:  12/25/2002  DATE OF DISCHARGE:  01/01/2003                                 DISCHARGE SUMMARY   DISCHARGE DIAGNOSES:  1. Klebsiella bacteremia.  2. Hyperemesis gravidarum.  3. Dehydration.  4. Normocytic anemia.  5. Systolic murmur, grade 2/6, left upper sternal border.  6. Pulmonary vascular congestion.  7. Thrombocytopenia.  8. Pregnancy, currently at 26-37 weeks intrauterine pregnancy.  9. 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   DISCHARGE MEDICATIONS:  1. Keflex 500 mg 1 tablet p.o. q.i.d. x7 days.  2. Reglan 10 mg q.a.c. and q.h.s.  3. Zofran 4 mg sublingual q. 6h p.r.n.  4. Phenergan suppositories 12.5 mg q. 4h p.r.n.  5. Prenatal vitamins 1 tablet p.o. q.d.   FOLLOW UP:  Appointment has been made at high risk clinic on Wednesday, January 07, 2003, at 9 a.m.   HISTORY OF PRESENT ILLNESS:  A 40 year old G3, P0-1-1-1 who presented at 26-  2/7 weeks. IUP with fever, chills, aches, and hyperemesis. Her fever started  on the day prior to admission with increased abdominal pain and cramps and  chills on the morning of admission. She presented with a bad headache but no  visual disturbances. Denied any confusion, dysuria, URI symptoms, or cough.  She had been receiving her prenatal care in [place] and was visiting  [place]. Her prenatal course was complicated by hyperemesis gravidarum  with an OB history consistent with a 36 week spontaneous vaginal delivery in  1995 with hyperemesis. She was taking Zofran IV q.6h. through her PICC line,  which she had for hyperemesis and taking Reglan on a p.r.n. basis p.o. Her  OB panel was  significant for a blood type of O negative, antibody negative.   PHYSICAL EXAMINATION:  VITAL SIGNS: Temperature was 103.4 on admission. She  was tachycardiac at 137 with a respiratory rate of 20 and blood pressure of  89/47.  GENERAL:  She appeared to be in moderate distress and vomiting. Her heart  was tachycardiac with a systolic murmur of grade 2/6.  LUNGS: Clear to auscultation and nonlabored.  ABDOMEN: Gravid, soft, nontender. No rebound, guarding, or rigidity. No CVA  tenderness.  EXTREMITIES: Without edema.  NEUROLOGIC: Negative for meningismus.  SKIN: Hot and diaphoretic.  HEENT: Oropharynx was without erythema.   Fetal heart rate was significant for baseline rate of 190. Good variability  but not reactive.   LABORATORY DATA:  CBC including white count of 10.1, hemoglobin 10.5,  hematocrit 31.7, platelet count 221,000. Her CMET was normal. Urinalysis  showed a specific  gravity of 1.020, 40 ketones, 30 protein, negative  nitrite, and negative leukocyte esterase.   HOSPITAL COURSE:  Problem 1.  Blood cultures from both the Pic line and  peripheral line were obtained and shortly after, her Pic line was removed  which had been in placed for 13 weeks. On hospital day two, blood culture  grew out gram negative rods and were identified on hospital day four as  Klebsiella bacteremia, which grew out from both her peripheral and Pic line  blood cultures. Cultures were sensitive to all antibiotics except for  Ampicillin. On hospital day 1, she was started on Fortaz and was consulted  by infectious disease and seen by Dr. Orvan Falconer. She received 1 day of  Vancomycin in addition to the [facility] and was stopped on hospital day 2. She  received a total of 4 days of IV Elita Quick and was then changed to IV Ancef  when her culture and sensitivity came back. On her day prior to discharge,  she was changed over to p.o. Keflex, as recommended by infectious disease.  She has successfully taken her  p.o. antibiotics without difficulty and has  significantly decreased her episodes of emesis. She remains afebrile since  hospital day 3. She failed to show any signs of sepsis during her  hospitalization besides some pulmonary vascular congestion, which she  experienced on hospital day 3, likely secondary to IV fluid overload.   Problem 2.  Hyperemesis gravidarum. The patient did come in with a Pic line  and was receiving IV fluids and nutritional supplements as well as  antiemetics through her Pic line on admission. She was continued on  antiemetics including Reglan, Zofran, and Phenergan, which improved her  episodes of emesis. She received IV fluids and as prior, did receive some  pulmonary vascular congestion secondary to volume overload with decreased  urine output, which resolved approximately 24 hours the day after. At  discharge time, she is vomiting approximately 1-2 times a day and has been  able to hold down her p.o. Keflex for at least one and one half hours after  each dose. She will be sent home on sublingual Zofran p.r., Phenergan and  p.o. Reglan.   Problem 3.  Thrombocytopenia. Platelet count notably went down to 111 on  December 27, 2002 and was checked three times thereafter. Her platelet count was  221 on admission. She had no signs of bleeding. No headache. No steatomata  or other signs of pre-eclampsia. Blood pressure remained normal. ID FE panel  was performed which was unremarkable. Infectious disease felt that the  thrombocytopenia was likely secondary to her acute infection.   Problem 4.  Normocytic anemia. Hemoglobin went from 10.5 to 8.5 down to 8.1.  She was checked serially for dropping hemoglobins and was likely felt to be  secondary to volume repletion. Peripheral smear was performed, which failed  to show any schistocytes.   SOCIAL HISTORY:  The patient will be staying in [place] for the duration of her pregnancy and will be following up with the high  risk clinic.  [place] will be sent with her to her clinic appointment on January 07, 2003.   FOLLOW UP:  Followup items should include:  1. Following low platelet count, decreased to 138 on last check on December 29, 2002.  2. Followup hemoglobin, last checked at 8.9 on December 29, 2002.  3. Followup her compliance with her Keflex at 500 mg p.o. q.i.d. x7  additional days to complete a 14 day course of therapy. Recommended that     if she starts vomiting within one to one and one-half hours after doses     of     antibiotic, she should return to maternal admissions.  4. Followup on her hyperemesis. She is without a PICC line currently, which     she came in with for rehydration. Doing well so far on p.o. antiemetics.     Continue to follow.       Lorne Skeens, D.O.                         Burnadette Peter, M.D.    Erick Alley  D:  01/01/2003  T:  01/03/2003  Job:  [id number]   cc:   Conni Elliot, M.D.  [address]  [place]  [place] [address]  Fax: [phone]   Broadus John  [address] [place]  [place] [address]  Fax: [phone]

## 2010-11-25 NOTE — Consult Note (Signed)
[name], [name]                            ACCOUNT NO.:  [id number]   MEDICAL RECORD NO.:  [id number]                   PATIENT TYPE:  INP   LOCATION:  9374                                 [facility]   PHYSICIAN:  Doylene Canning. Ladona Ridgel, M.D.               DATE OF BIRTH:  1971-01-24   DATE OF CONSULTATION:  03/16/2003  DATE OF DISCHARGE:                                   CONSULTATION   REFERRING PHYSICIAN:  Dr. Melvyn Novas.   REASON FOR REFERRAL:  Evaluation of possible heart failure.   INDICATION FOR CONSULTATION:  Evaluation of possible congestive heart  failure associated with volume overload and enlarged cardiac silhouette.   HISTORY OF PRESENT ILLNESS:  The patient is a very pleasant 40 year old  woman who was admitted to the hospital at 38-1/[redacted] weeks gestation with  symptoms of preeclampsia and went on to have a successful spontaneous  vaginal delivery.  Her postdelivery course was complicated by a volume  overload and evidence of enlarged cardiac silhouette on chest x-ray  associated with mild hypoxemia.  She is referred for additional evaluation  and treatment.   PAST MEDICAL HISTORY:  Her past medical history is really unremarkable.  She  denies any medical problems and denies taking regular medications.   FAMILY HISTORY:  Negative for early coronary disease.   SOCIAL HISTORY:  The patient denies tobacco or ethanol use.  She previously  resided in [place] but has moved back to [place].   REVIEW OF SYSTEMS:  Negative except as previously noted as well as a  hyperemesis gravidarum.   PHYSICAL EXAMINATION:  GENERAL:  She is a pleasant 40 year old postpartum  woman in no respiratory distress.  VITAL SIGNS:  Blood pressure today was 140/80.  The pulse was 90 and  regular.  Respirations were 20-22.  She was afebrile.  HEENT:  Normocephalic, atraumatic.  Pupils equal and round.  The oropharynx  was moist.  The sclerae anicteric.  NECK:  Revealed no jugular venous  distention.  The carotids were 2+ and  symmetric.  The trachea was midline.  There was no thyromegaly.  LUNGS:  Revealed rales in the bases.  CARDIOVASCULAR:  Regular rate and rhythm with an S4 gallop.  There were no  obvious murmurs.  ABDOMEN:  Soft, nontender, nondistended.  EXTREMITIES:  Demonstrated 1+ peripheral edema.   Her EKG is pending.   IMPRESSION:  1. Status post a vaginal delivery with preeclampsia.  2. 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   DISCUSSION:  I would recommend continued IV Lasix.  Will obtain a 2-D echo.  Will follow up her BNP.  If she does in fact have LV dysfunction then  initiation of beta-blocker and ACE inhibitor therapy would be warranted in  conjunction with her Lasix.  Doylene Canning. Ladona Ridgel, M.D.    GWT/MEDQ  D:  03/16/2003  T:  03/16/2003  Job:  [id number]   cc:   Kathrine Cords, R.N. [facility]

## 2010-12-23 ENCOUNTER — Encounter: Payer: Self-pay | Admitting: Family Medicine

## 2010-12-23 ENCOUNTER — Ambulatory Visit (INDEPENDENT_AMBULATORY_CARE_PROVIDER_SITE_OTHER): Payer: BC Managed Care – PPO | Admitting: Family Medicine

## 2010-12-23 DIAGNOSIS — R10819 Abdominal tenderness, unspecified site: Secondary | ICD-10-CM

## 2010-12-23 DIAGNOSIS — M549 Dorsalgia, unspecified: Secondary | ICD-10-CM

## 2010-12-23 DIAGNOSIS — N393 Stress incontinence (female) (male): Secondary | ICD-10-CM

## 2010-12-23 DIAGNOSIS — M461 Sacroiliitis, not elsewhere classified: Secondary | ICD-10-CM

## 2010-12-23 DIAGNOSIS — S336XXA Sprain of sacroiliac joint, initial encounter: Secondary | ICD-10-CM

## 2010-12-23 HISTORY — DX: Sacroiliitis, not elsewhere classified: M46.1

## 2010-12-23 LAB — POCT URINALYSIS DIPSTICK
Blood, UA: NEGATIVE
Glucose, UA: NEGATIVE
Nitrite, UA: NEGATIVE
Protein, UA: NEGATIVE
Spec Grav, UA: >=1.03
Urobilinogen, UA: 0.2
pH, UA: 5.5

## 2010-12-23 MED ORDER — NAPROXEN 500 MG PO TABS: 500.0000 mg | ORAL_TABLET | Freq: Two times a day (BID) | ORAL | Status: DC

## 2010-12-23 MED ORDER — CYCLOBENZAPRINE HCL 10 MG PO TABS: ORAL_TABLET | ORAL | Status: DC

## 2010-12-23 NOTE — Assessment & Plan Note (Signed)
New and worsening over the past several days, unable to rest or find a comfortable position last night, sitting is uncomfortable as well. Exam c/w sacroiliitis, Given Flexeril and Naproxen to use bid, apply moist heat and seek immediate care if symptoms worsen, notify office if symptoms do not improve

## 2010-12-23 NOTE — Progress Notes (Signed)
[name] [id number] [birth year] 12/23/2010      Progress Note-Follow Up  Subjective  Chief Complaint  Chief Complaint  Patient presents with  . Leg Pain    X 3 days- left/ tingling  . Back Pain    X 2 weeks- left tingling  . Hip Pain    X 3 days- left tingling    HPI  40 year old AA female for evaluation of back and hip pain. She has been struggling with and low back pain for roughly 2-3 weeks female but had found the pain tolerable. Then over the last 3 days she began to have trouble with her left posterior hip. She reports she is unable to get comfortable in the sitting position or lying position. Occasionally she can stand up right position in the left hip pain eases off somewhat. She has pain over the left hip with certain movements or with sitting or lying the pain shoots any sharp down the left leg. She denies any trauma or falls. She denies any persistent pain on the left leg but does have shooting pains with numbness and tingling at times. She has a long history of slight stress urinary incontinence and did have one episode of laughing and losing some urine recently but no consistency or recurrence of that. She had no fecal incontinence or abdominal pain. She denies any fevers, chills, chest pain, palpitations or recent illness. She tried some Benadryl and BenGay less likely help get comfortable and was unable to sleep so she is here today for further treatment.  Past Medical History  Diagnosis Date  . Sacroiliitis 12/23/2010    History reviewed. No pertinent past surgical history.  History reviewed. No pertinent family history.  History   Social History  . Marital Status: Married    Spouse Name: N/A    Number of Children: N/A  . Years of Education: N/A   Occupational History  . Not on file.   Social History Main Topics  . Smoking status: Never Smoker   . Smokeless tobacco: Never Used  . Alcohol Use: No  . Drug Use: No  . Sexually Active: Yes -- Female  partner(s)   Other Topics Concern  . Not on file   Social History Narrative  . No narrative on file     No Known Allergies  Review of Systems  Review of Systems  Constitutional: Negative for fever and malaise/fatigue.  HENT: Negative for congestion.   Eyes: Negative for discharge.  Respiratory: Negative for shortness of breath.   Cardiovascular: Negative for chest pain, palpitations and leg swelling.  Gastrointestinal: Negative for nausea, abdominal pain and diarrhea.  Genitourinary: Negative for dysuria.  Musculoskeletal: Negative for falls.  Skin: Negative for rash.  Neurological: Negative for loss of consciousness and headaches.  Endo/Heme/Allergies: Negative for polydipsia.  Psychiatric/Behavioral: Negative for depression and suicidal ideas. The patient is not nervous/anxious and does not have insomnia.     Objective  BP 116/79  Pulse 77  Temp(Src) 98.2 F (36.8 C) (Oral)  Ht 5\' 4"  (1.626 m)  Wt 168 lb 1.9 oz (76.259 kg)  BMI 28.86 kg/m2  SpO2 99%  LMP 12/09/2010  Physical Exam  Physical Exam  Constitutional: She is oriented to person, place, and time and well-developed, well-nourished, and in no distress. No distress.  HENT:  Head: Normocephalic and atraumatic.  Eyes: Conjunctivae are normal.  Neck: Neck supple. No thyromegaly present.  Cardiovascular: Normal rate, regular rhythm and normal heart sounds.   No murmur heard.  Pulmonary/Chest: Effort normal and breath sounds normal. She has no wheezes.  Abdominal: She exhibits no distension and no mass.  Musculoskeletal: She exhibits tenderness. She exhibits no edema.       Tender with palp over posterior sacroiliac joint left hip, pain with straight leg raise on left. Slight pain over left paravertebral muscles in lumbar region. No pain over spine itself or over right paravertebral muscles. Has some referred pain over posterior left hip when we raise right leg passively. No right leg pain. No pain in either hip  joint with internal or external passive hip rotation  Lymphadenopathy:    She has no cervical adenopathy.  Neurological: She is alert and oriented to person, place, and time.  Skin: Skin is warm and dry. No rash noted. She is not diaphoretic.  Psychiatric: Memory, affect and judgment normal.    Lab Results  Component Value Date   TSH 1.10 07/17/2008   Lab Results  Component Value Date   WBC 6.8 06/01/2010   HGB 12.1 06/01/2010   HCT 35.9* 06/01/2010   MCV 93.0 06/01/2010   PLT 277 06/01/2010   Lab Results  Component Value Date   CREATININE 0.92 06/01/2010   BUN 14 06/01/2010   NA 138 06/01/2010   K 3.8 06/01/2010   CL 103 06/01/2010   CO2 27 06/01/2010   Lab Results  Component Value Date   ALT 15 06/01/2010   AST 15 06/01/2010   ALKPHOS 45 06/01/2010   BILITOT 0.6 06/01/2010   Lab Results  Component Value Date   CHOL 189 07/17/2008   Lab Results  Component Value Date   HDL 73.2 07/17/2008   Lab Results  Component Value Date   LDLCALC 107* 07/17/2008   Lab Results  Component Value Date   TRIG 45 07/17/2008   Lab Results  Component Value Date   CHOLHDL 2.6 CALC 07/17/2008     Assessment & Plan  ABDOMINAL TENDERNESS UNSPECIFIED SITE None noted today  BACK PAIN Had been struggling with intermittent low back pain for a couple of weeks now, it had been tolerable until she developed some pain over left posterior hip pain and radicular pain down left leg as well. She has tried rubbing down with Bengay and tried taking Benadryl last night to help her rest but was unsuccessful. No significant central low back pain today, Pain is centered over left posterior hip. Will start Naproxen and Flexeril and she will apply moist heat. She will notify us if no improvement and she will seek immediate medical care over the weekend if symptoms worsen notably despite treatment.  Sacroiliitis New and worsening over the past several days, unable to rest or find a comfortable position last  night, sitting is uncomfortable as well. Exam c/w sacroiliitis, Given Flexeril and Naproxen to use bid, apply moist heat and seek immediate care if symptoms worsen, notify office if symptoms do not improve

## 2010-12-23 NOTE — Patient Instructions (Signed)
Hip Pain The hips join the upper legs to the lower pelvis. The bones, cartilage, tendons, and muscles of the hip joint perform a lot of work each day holding your body weight and allowing you to move around. Hip pain is a common symptom. It can range from a minor ache to severe pain on 1 or both hips. Pain may be felt on the inside of the hip joint near the groin, or the outside near the buttocks and upper thigh. There may be swelling or stiffness as well. It occurs more often when a person walks or performs activity. There are many reasons hip pain can develop. CAUSES It is important to work with your caregiver to identify the cause since many conditions can impact the bones, cartilage, muscles, and tendons of the hips. Causes for hip pain include:  Broken (fractured) bones.   Separation of the thighbone from the hip socket (dislocation).   Torn cartilage of the hip joint.   Swelling (inflammation) of a tendon (tendonitis), the sac within the hip joint (bursitis), or a joint.   A weakening in the abdominal wall (hernia), affecting the nerves to the hip.   Arthritis in the hip joint or lining of the hip joint.   Pinched nerves in the back, hip, or upper thigh.   A bulging disc in the spine (herniated disc).   Rarely, bone infection or cancer.  DIAGNOSIS The location of your hip pain will help your caregiver understand what may be causing the pain. A diagnosis is based on your medical history, your symptoms, results from your physical exam, and results from diagnostic tests. Diagnostic tests may include X-ray exams, a computerized magnetic scan (magnetic resonance imaging, MRI), or bone scan. TREATMENT Treatment will depend on the cause of your hip pain. Treatment may include:  Limiting activities and resting until symptoms improve.   Crutches or other walking supports (a cane or brace).   Ice, elevation, and compression.   Physical therapy or home exercises.   Shoe inserts or  special shoes.   Losing weight.   Medications to reduce pain.   Undergoing surgery.  HOME CARE INSTRUCTIONS  Only take over-the-counter or prescription medicines for pain, discomfort, or fever as directed by your caregiver.   Put ice on the injured area:   Put ice in a plastic bag.   Place a towel between your skin and the bag.   Leave the ice/heat on for 15 minutes at a time 2 times a day.   Keep your leg raised (elevated) when possible to lessen swelling.   Avoid activities that cause pain.   Follow specific exercises as directed by your caregiver.   Sleep with a pillow between your legs on your most comfortable side.   Record how often you have hip pain, the location of the pain, and what it feels like. This information may be helpful to you and your caregiver.   Ask your caregiver about returning to work or sports and whether you should drive.   Follow up with your caregiver for further exams, therapy, or testing as directed.  SEEK MEDICAL CARE IF:  Pain or swelling continues or worsens beyond 1 week.   You have an oral temperature above 104.   You are feeling unwell or have chills.   You are having an increasingly difficult time with walking.   You have a loss of sensation or other new symptoms.   You have questions or concerns.  SEEK IMMEDIATE MEDICAL CARE IF:  You cannot put weight on the affected hip.   You have fallen.   You have a sudden increase in pain and swelling in your hip.   You have an oral temperature above 104, not controlled by medicine.  MAKE SURE YOU:  Understand these instructions.   Will watch your condition.   Will get help right away if you are not doing well or get worse.  Document Released: 12/14/2009  [facility] Patient Information 2011 [place], [place].  Apply moist heat, gentle stretching at least twice daily, seek immediate medical care if symptoms worsen or persistent urinary or fecal incontinence occurs

## 2010-12-23 NOTE — Assessment & Plan Note (Signed)
None noted today 

## 2010-12-23 NOTE — Assessment & Plan Note (Signed)
Had been struggling with intermittent low back pain for a couple of weeks now, it had been tolerable until she developed some pain over left posterior hip pain and radicular pain down left leg as well. She has tried rubbing down with Bengay and tried taking Benadryl last night to help her rest but was unsuccessful. No significant central low back pain today, Pain is centered over left posterior hip. Will start Naproxen and Flexeril and she will apply moist heat. She will notify us if no improvement and she will seek immediate medical care over the weekend if symptoms worsen notably despite treatment.

## 2011-03-14 ENCOUNTER — Inpatient Hospital Stay (HOSPITAL_COMMUNITY)
Admission: AD | Admit: 2011-03-14 | Discharge: 2011-03-14 | Disposition: A | Discharge status: Home / Self Care | Payer: BC Managed Care – PPO | Source: Ambulatory Visit | Attending: Obstetrics & Gynecology | Admitting: Obstetrics & Gynecology

## 2011-03-14 ENCOUNTER — Encounter (HOSPITAL_COMMUNITY): Payer: Self-pay | Admitting: Obstetrics and Gynecology

## 2011-03-14 DIAGNOSIS — O210 Mild hyperemesis gravidarum: Secondary | ICD-10-CM | POA: Diagnosis present

## 2011-03-14 LAB — URINALYSIS, ROUTINE W REFLEX MICROSCOPIC
Bilirubin Urine: NEGATIVE
Glucose, UA: NEGATIVE mg/dL
Hgb urine dipstick: NEGATIVE
Ketones, ur: NEGATIVE mg/dL
Protein, ur: NEGATIVE mg/dL
Urobilinogen, UA: 1 mg/dL (ref 0.0–1.0)

## 2011-03-14 MED ORDER — PROMETHAZINE HCL 25 MG/ML IJ SOLN
25.0000 mg | Freq: Once | INTRAMUSCULAR | Status: AC
  Administered 2011-03-14: 25 mg via INTRAVENOUS
  Filled 2011-03-14: qty 1

## 2011-03-14 MED ORDER — SODIUM CHLORIDE 0.9 % IV SOLN
8.0000 mg | Freq: Once | INTRAVENOUS | Status: DC
  Filled 2011-03-14: qty 4

## 2011-03-14 MED ORDER — M.V.I. ADULT IV INJ
10.0000 mL | Freq: Once | INTRAVENOUS | Status: AC
  Administered 2011-03-14: 10 mL via INTRAVENOUS
  Filled 2011-03-14: qty 10

## 2011-03-14 MED ORDER — METOCLOPRAMIDE HCL 5 MG/ML IJ SOLN
10.0000 mg | Freq: Once | INTRAMUSCULAR | Status: AC
  Administered 2011-03-14: 10 mg via INTRAVENOUS
  Filled 2011-03-14: qty 2

## 2011-03-14 MED ORDER — KCL IN DEXTROSE-NACL 20-5-0.45 MEQ/L-%-% IV SOLN
Freq: Once | INTRAVENOUS | Status: AC
  Administered 2011-03-14: 20:00:00 via INTRAVENOUS
  Filled 2011-03-14: qty 1000

## 2011-03-14 MED ORDER — LACTATED RINGERS IV SOLN: INTRAVENOUS | Status: DC

## 2011-03-14 NOTE — Progress Notes (Signed)
Pt is [redacted] weeks pregnant. Has not been able to keep anything down for 4 weeks. Was sent by Dr. Cherly Hensen office.

## 2011-03-14 NOTE — Progress Notes (Signed)
[name] is an 40 y.o. female at 8.4/7 wks, in ER for ongoing nausea/vomiting since 2-3 wks, getting worse, dizzy and very fatigued, feels acid reflux getting worse. Tried Zofran at home, not helping, not able to keep any fluids/food down. Denies vag blding/ abdo pain.   [facility], Dr Cherly Hensen.   Patient's last menstrual period was 01/13/2011.   Past Medical History  Diagnosis Date  . Sacroiliitis 12/23/2010   No past surgical history on file.  No family history on file.  Social History:  reports that she has never smoked. She has never used smokeless tobacco. She reports that she does not drink alcohol or use illicit drugs.  Allergies: No Known Allergies  Prescriptions prior to admission  Medication Sig Dispense Refill  . promethazine (PHENERGAN) 25 MG tablet Take 25 mg by mouth every 6 (six) hours as needed. For nausea       . cyclobenzaprine (FLEXERIL) 10 MG tablet 1/2 to 1 tab po bid prn pain  May cause sedation  60 tablet  0  . naproxen (NAPROSYN) 500 MG tablet Take 1 tablet (500 mg total) by mouth 2 (two) times daily with a meal.  60 tablet  2   ROS: Denies SOB/chest pain/ HA/ vision changes/ LE pain or swelling/ etc.  Objective:  Blood pressure 95/62, pulse 74, temperature 98.4 F (36.9 C), temperature source Oral, resp. rate 20, last menstrual period 01/13/2011. A&O x 3, no acute distress. Pleasant, but appears tired. HEENT neg, no thyromegaly Lungs CTA bilat CV RRR, A1S2 normal Abdo soft, non tender, non acute Extr no edema/ tenderness Pelvic deferred.  Results for orders placed during the hospital encounter of 03/14/11 (from the past 24 hour(s))  URINALYSIS, ROUTINE W REFLEX MICROSCOPIC     Status: Normal   Collection Time   03/14/11  7:12 PM      Component Value Range   Color, Urine YELLOW  YELLOW    Appearance CLEAR  CLEAR    Specific Gravity, Urine 1.015  1.005 - 1.030    pH 8.0  5.0 - 8.0    Glucose, UA NEGATIVE  NEGATIVE (mg/dL)   Hgb urine dipstick NEGATIVE  NEGATIVE    Bilirubin Urine NEGATIVE  NEGATIVE    Ketones, ur NEGATIVE  NEGATIVE (mg/dL)   Protein, ur NEGATIVE  NEGATIVE (mg/dL)   Urobilinogen, UA 1.0  0.0 - 1.0 (mg/dL)   Nitrite NEGATIVE  NEGATIVE    Leukocytes, UA NEGATIVE  NEGATIVE     Assessment/Plan: 8+ wks, hyperemesis. UA ketones neg.  IVF- 2 liters- one with glucose and K, 2nd with Multivitamin supplement Reglan, Phenergan IV and reassess with popsicle/ clears once nausea/vomiting subside. \ If recovers well, plan to cont Reglan PO and Compro rectal suppository at bedtime after discharge.  Plan d/w pt, agrees.     Gabriele Loveland R 03/14/2011, 9:11 PM

## 2011-03-21 LAB — HEPATITIS B SURFACE ANTIGEN: Hepatitis B Surface Ag: NEGATIVE

## 2011-03-21 LAB — ABO/RH

## 2011-04-04 ENCOUNTER — Encounter (HOSPITAL_COMMUNITY): Payer: Self-pay | Admitting: *Deleted

## 2011-04-04 ENCOUNTER — Inpatient Hospital Stay (HOSPITAL_COMMUNITY)
Admission: AD | Admit: 2011-04-04 | Discharge: 2011-04-05 | Disposition: A | Discharge status: Home / Self Care | Payer: BC Managed Care – PPO | Source: Ambulatory Visit | Attending: Obstetrics & Gynecology | Admitting: Obstetrics & Gynecology

## 2011-04-04 DIAGNOSIS — O210 Mild hyperemesis gravidarum: Secondary | ICD-10-CM | POA: Insufficient documentation

## 2011-04-04 HISTORY — DX: Cardiac murmur, unspecified: R01.1

## 2011-04-04 LAB — URINALYSIS, ROUTINE W REFLEX MICROSCOPIC
Bilirubin Urine: NEGATIVE
Glucose, UA: NEGATIVE mg/dL
Hgb urine dipstick: NEGATIVE
Specific Gravity, Urine: 1.025 (ref 1.005–1.030)
Urobilinogen, UA: 1 mg/dL (ref 0.0–1.0)
pH: 6 (ref 5.0–8.0)

## 2011-04-04 LAB — COMPREHENSIVE METABOLIC PANEL
Alkaline Phosphatase: 42 U/L (ref 39–117)
BUN: 12 mg/dL (ref 6–23)
CO2: 24 mEq/L (ref 19–32)
Chloride: 101 mEq/L (ref 96–112)
GFR calc Af Amer: >60 mL/min (ref >60)
GFR calc non Af Amer: >60 mL/min (ref >60)
Glucose, Bld: 101 mg/dL — ABNORMAL HIGH (ref 70–99)
Potassium: 3.3 mEq/L — ABNORMAL LOW (ref 3.5–5.1)
Total Bilirubin: 0.5 mg/dL (ref 0.3–1.2)

## 2011-04-04 MED ORDER — LACTATED RINGERS IV SOLN: 10.0000 mL | Freq: Once | INTRAVENOUS | Status: DC

## 2011-04-04 MED ORDER — PROMETHAZINE HCL 25 MG/ML IJ SOLN: 25.0000 mg | Freq: Once | INTRAVENOUS | Status: DC

## 2011-04-04 MED ORDER — DEXTROSE 5 % IN LACTATED RINGERS IV BOLUS: 1000.0000 mL | Freq: Once | INTRAVENOUS | Status: DC

## 2011-04-04 MED ORDER — PROMETHAZINE HCL 25 MG/ML IJ SOLN
Freq: Once | INTRAVENOUS | Status: AC
  Administered 2011-04-05: via INTRAVENOUS
  Filled 2011-04-04: qty 1000

## 2011-04-04 NOTE — Progress Notes (Signed)
Pt states she has had N&V the entire prg.-has gotten worse since last night

## 2011-04-04 NOTE — Progress Notes (Signed)
Pt reports continued nausea, vomiting with pregnancy, worsening over last 2 days. Office called her and told her to come to hospital for ivf's

## 2011-04-05 NOTE — ED Provider Notes (Signed)
History  [name] 40 y.o. [id number]      Chief Complaint  Patient presents with  . Emesis During Pregnancy    OB History    Grav Para Term Preterm Abortions TAB SAB Ect Mult Living   4 3 1 2      3       Past Medical History  Diagnosis Date  . Sacroiliitis 12/23/2010  . Heart murmur     Past Surgical History  Procedure Date  . Rt shoulder surgery     No family history on file.  History  Substance Use Topics  . Smoking status: Never Smoker   . Smokeless tobacco: Never Used  . Alcohol Use: No    Allergies: No Known Allergies  Prescriptions prior to admission  Medication Sig Dispense Refill  . metoCLOPramide (REGLAN) 10 MG tablet Take 10 mg by mouth daily.        . promethazine (PHENERGAN) 25 MG tablet Take 25 mg by mouth every 6 (six) hours as needed. For nausea       . cyclobenzaprine (FLEXERIL) 10 MG tablet 1/2 to 1 tab po bid prn pain  May cause sedation  60 tablet  0  . naproxen (NAPROSYN) 500 MG tablet Take 1 tablet (500 mg total) by mouth 2 (two) times daily with a meal.  60 tablet  2    Physical Exam   Blood pressure 112/68, pulse 73, temperature 98.3 F (36.8 C), resp. rate 18, height 5\' 4"  (1.626 m), weight 75.751 kg (167 lb), last menstrual period 01/13/2011.   ED Course  Rehydrated IV with Phenergan in bag.  Improved N/V.  A/P 12 +wks Hyperemesis improved with IV rehydration.  Well with Phenergan.  Genia Del MD   04/05/11  2:16 am

## 2011-04-07 LAB — CBC
Platelets: 281
RDW: 13.2
WBC: 8

## 2011-04-09 ENCOUNTER — Encounter (HOSPITAL_COMMUNITY): Payer: Self-pay | Admitting: *Deleted

## 2011-04-09 ENCOUNTER — Inpatient Hospital Stay (HOSPITAL_COMMUNITY)
Admission: AD | Admit: 2011-04-09 | Discharge: 2011-04-10 | Disposition: A | Discharge status: Home / Self Care | Payer: BC Managed Care – PPO | Source: Ambulatory Visit | Attending: Obstetrics and Gynecology | Admitting: Obstetrics and Gynecology

## 2011-04-09 DIAGNOSIS — O200 Threatened abortion: Secondary | ICD-10-CM | POA: Diagnosis present

## 2011-04-09 HISTORY — DX: Leiomyoma of uterus, unspecified: D25.9

## 2011-04-09 HISTORY — DX: Unspecified ovarian cyst, unspecified side: N83.209

## 2011-04-09 HISTORY — DX: Mild hyperemesis gravidarum: O21.0

## 2011-04-09 MED ORDER — MAGIC MOUTHWASH: 15.0000 mL | Freq: Three times a day (TID) | ORAL | Status: DC

## 2011-04-09 MED ORDER — RHO D IMMUNE GLOBULIN 1500 UNIT/2ML IJ SOLN
300.0000 ug | Freq: Once | INTRAMUSCULAR | Status: AC
  Administered 2011-04-10: 300 ug via INTRAMUSCULAR
  Filled 2011-04-09: qty 2

## 2011-04-09 MED ORDER — MAGIC MOUTHWASH
15.0000 mL | Freq: Once | ORAL | Status: AC
  Administered 2011-04-09: 15 mL via ORAL
  Filled 2011-04-09: qty 15

## 2011-04-09 NOTE — Progress Notes (Signed)
Pt had intercourse yesterday, has been treated for hyperemesis with zofran, phenergan, and reglan, but was vomiting today when she began to have mod amt red vag bleeding.  Had [facility] seen on u/s.

## 2011-04-09 NOTE — ED Provider Notes (Signed)
History     Chief Complaint  Patient presents with  . Vaginal Bleeding   HPI 40 yo [id number] MBF now @ 12 2/7 wk presents w/ c/o painless vaginal bleeding. Pt had intercourse yesterday. PNC complicated by HEG. Pt had sono 12 wk ago which showed [facility]. O neg  OB History    Grav Para Term Preterm Abortions TAB SAB Ect Mult Living   4 3 1 2      3       Past Medical History  Diagnosis Date  . Sacroiliitis 12/23/2010  . Heart murmur   . Ovarian cyst   . Fibroid, uterine   . Hyperemesis gravidarum     Past Surgical History  Procedure Date  . Rt shoulder surgery   . Tonsillectomy and adenoidectomy     No family history on file.  History  Substance Use Topics  . Smoking status: Never Smoker   . Smokeless tobacco: Never Used  . Alcohol Use: No    Allergies: No Known Allergies  Prescriptions prior to admission  Medication Sig Dispense Refill  . metoCLOPramide (REGLAN) 10 MG tablet Take 10 mg by mouth daily.        . ondansetron (ZOFRAN-ODT) 4 MG disintegrating tablet Take 4 mg by mouth every 8 (eight) hours as needed. For nausea       . promethazine (PHENERGAN) 25 MG tablet Take 25 mg by mouth every 6 (six) hours as needed. For nausea         Review of Systems  Constitutional: Positive for weight loss.  HENT: Positive for sore throat.   Gastrointestinal: Positive for nausea and vomiting. Negative for abdominal pain.  Genitourinary:       Vaginal bleeding   Physical Exam   Blood pressure 124/64, pulse 67, temperature 98.4 F (36.9 C), temperature source Oral, resp. rate 20, height 5\' 4"  (1.626 m), weight 167 lb (75.751 kg), last menstrual period 01/13/2011.  Physical Exam  Constitutional: She appears well-developed and well-nourished.  HENT:  Head: Normocephalic.  Cardiovascular: Regular rhythm.   Respiratory: Breath sounds normal.  GI: Soft.  Genitourinary: Vagina normal and uterus normal.       Ext os 1 cm int os closed. Scant vaginal blood noted dark red./brown.  No active bleeding currently. Parous os. Uterus ant 12 wk size  Neurological: She is alert.  Skin: Skin is warm and dry.    MAU Course  Procedures bedside sono: active fetus. [facility] present (+) FHR 160( doppler)  MDM  Assessment and Plan  Threatened AB IUP @ 12 2/7 Rh neg.  HEG w/ assoc sore throat P) threatened AB prec. Rhophylac IM. Magic mouthwash. Keep ob appt Tues. Abstain from intercourse Emojean Gertz A 04/09/2011, 11:11 PM

## 2011-04-11 LAB — RH IG WORKUP (INCLUDES ABO/RH)
Antibody Screen: NEGATIVE
Gestational Age(Wks): 12

## 2011-04-18 LAB — URINE MICROSCOPIC-ADD ON

## 2011-04-18 LAB — CBC
HCT: 40.2
Hemoglobin: 13.6
MCHC: 33.7
Platelets: 298
RDW: 12.5

## 2011-04-18 LAB — URINALYSIS, ROUTINE W REFLEX MICROSCOPIC
Glucose, UA: NEGATIVE
Leukocytes, UA: NEGATIVE
Nitrite: NEGATIVE
Protein, ur: NEGATIVE
pH: 6

## 2011-04-18 LAB — POCT PREGNANCY, URINE: Preg Test, Ur: NEGATIVE

## 2011-06-06 ENCOUNTER — Encounter (HOSPITAL_COMMUNITY): Payer: Self-pay | Admitting: *Deleted

## 2011-06-06 ENCOUNTER — Inpatient Hospital Stay (HOSPITAL_COMMUNITY)
Admission: AD | Admit: 2011-06-06 | Discharge: 2011-06-06 | Disposition: A | Discharge status: Home / Self Care | Payer: BC Managed Care – PPO | Source: Ambulatory Visit | Attending: Obstetrics and Gynecology | Admitting: Obstetrics and Gynecology

## 2011-06-06 DIAGNOSIS — O99891 Other specified diseases and conditions complicating pregnancy: Secondary | ICD-10-CM | POA: Insufficient documentation

## 2011-06-06 DIAGNOSIS — K529 Noninfective gastroenteritis and colitis, unspecified: Secondary | ICD-10-CM | POA: Diagnosis present

## 2011-06-06 DIAGNOSIS — E860 Dehydration: Secondary | ICD-10-CM | POA: Insufficient documentation

## 2011-06-06 DIAGNOSIS — A088 Other specified intestinal infections: Secondary | ICD-10-CM | POA: Insufficient documentation

## 2011-06-06 LAB — URINALYSIS, ROUTINE W REFLEX MICROSCOPIC
Glucose, UA: NEGATIVE mg/dL
Hgb urine dipstick: NEGATIVE
Ketones, ur: >80 mg/dL — AB
Leukocytes, UA: NEGATIVE
Protein, ur: NEGATIVE mg/dL
pH: 6.5 (ref 5.0–8.0)

## 2011-06-06 MED ORDER — DEXTROSE IN LACTATED RINGERS 5 % IV SOLN
Freq: Once | INTRAVENOUS | Status: AC
  Administered 2011-06-06: 11:00:00 via INTRAVENOUS

## 2011-06-06 MED ORDER — PROMETHAZINE HCL 25 MG/ML IJ SOLN
12.5000 mg | INTRAMUSCULAR | Status: DC
  Administered 2011-06-06: 12.5 mg via INTRAVENOUS
  Filled 2011-06-06: qty 1

## 2011-06-06 MED ORDER — LACTATED RINGERS IV BOLUS (SEPSIS)
1000.0000 mL | Freq: Once | INTRAVENOUS | Status: AC
  Administered 2011-06-06: 1000 mL via INTRAVENOUS

## 2011-06-06 MED ORDER — GI COCKTAIL ~~LOC~~
30.0000 mL | Freq: Once | ORAL | Status: AC
  Administered 2011-06-06: 30 mL via ORAL
  Filled 2011-06-06: qty 30

## 2011-06-06 MED ORDER — ACETAMINOPHEN 650 MG RE SUPP
650.0000 mg | RECTAL | Status: DC | PRN
  Administered 2011-06-06: 650 mg via RECTAL
  Filled 2011-06-06: qty 1

## 2011-06-06 MED ORDER — FAMOTIDINE IN NACL 20-0.9 MG/50ML-% IV SOLN
20.0000 mg | INTRAVENOUS | Status: AC
  Administered 2011-06-06: 20 mg via INTRAVENOUS
  Filled 2011-06-06: qty 50

## 2011-06-06 MED ORDER — RANITIDINE HCL 150 MG PO TABS: 150.0000 mg | ORAL_TABLET | Freq: Every day | ORAL | Status: DC

## 2011-06-06 NOTE — ED Provider Notes (Signed)
History   C/O nausea and vomiting - fever and chills. + General malaise. Family has been sick with GI virus since Thanksgiving - patient started with symptoms late last night. Unable to tolerate PO intake. + hearburn and burning in throat from vomiting.  Chief Complaint  Patient presents with  . Emesis   Emesis  This is a new problem. The current episode started today. The problem occurs more than 10 times per day. The problem has been unchanged. The emesis has an appearance of stomach contents. The maximum temperature recorded prior to her arrival was 100 - 100.9 F. The fever has been present for less than 1 day. Associated symptoms include abdominal pain, chills, a fever, sweats and URI. Pertinent negatives include no diarrhea, dizziness or headaches. Risk factors include ill contacts.    Past Medical History  Diagnosis Date  . Sacroiliitis 12/23/2010  . Heart murmur   . Ovarian cyst   . Fibroid, uterine   . Hyperemesis gravidarum     Past Surgical History  Procedure Date  . Rt shoulder surgery   . Tonsillectomy and adenoidectomy     Family History  Problem Relation Age of Onset  . Anesthesia problems Neg Hx     History  Substance Use Topics  . Smoking status: Never Smoker   . Smokeless tobacco: Never Used  . Alcohol Use: No    Allergies: No Known Allergies  Prescriptions prior to admission  Medication Sig Dispense Refill  . promethazine (PHENERGAN) 25 MG suppository Place 25 mg rectally every 6 (six) hours as needed. nausea       . promethazine (PHENERGAN) 25 MG tablet Take 25 mg by mouth every 6 (six) hours as needed. For nausea         Review of Systems  Constitutional: Positive for fever, chills and malaise/fatigue.  Gastrointestinal: Positive for heartburn, nausea, vomiting and abdominal pain. Negative for diarrhea.  Neurological: Negative for dizziness and headaches.   Physical Exam   Blood pressure 115/61, pulse 114, temperature 100.6 F (38.1 C),  temperature source Oral, resp. rate 18, height 5\' 4"  (1.626 m), weight 75.751 kg (167 lb), last menstrual period 01/13/2011, SpO2 96.00%.  Physical Exam  Constitutional: She is oriented to person, place, and time. She appears well-developed and well-nourished.  Neck: Normal range of motion.  GI: Soft.  Musculoskeletal: Normal range of motion.  Neurological: She is oriented to person, place, and time.  Skin: Skin is warm.  FHT 190  MAU Course  Procedures IV hydration    Assessment and Plan  20 weeks with viral gastroenteritis (family sick since Thanksgiving with GI virus) Dehydration  1) supportive care with IVF / antiemetics / tylenol 2) discharge home 4-6 hours when tolerating PO intake 3) contact precautions  Shacoria Latif 06/06/2011, 9:54 AM     TC from nurse to update on status. NO further vomiting - tolerating PO intake / tolerated GI cocktail for reflux / heartburn. FHT 150-160 range now. Ready for discharge home. Rx at home for phenergan and zofran prn.  Marlinda Mike 06/06/2011 at 1251pm

## 2011-06-06 NOTE — Progress Notes (Signed)
States has had vomiting throughout the pregnancy, but seemed to get worse 2 days ago. States her daughters have had colds and GI upset. C/O chest soreness from coughing and vomiting. C/O  Back pain and HA. States usually she can take medication and manage N/V.  States she has been vomiting everyday since Sunday and continuously since 0100.

## 2011-06-06 NOTE — Progress Notes (Signed)
Patient spitting

## 2011-06-08 ENCOUNTER — Ambulatory Visit (INDEPENDENT_AMBULATORY_CARE_PROVIDER_SITE_OTHER): Payer: BC Managed Care – PPO | Admitting: Family Medicine

## 2011-06-08 ENCOUNTER — Encounter: Payer: Self-pay | Admitting: Family Medicine

## 2011-06-08 VITALS — BP 107/73 | HR 99 | Temp 98.2°F | Ht 64.0 in | Wt 170.0 lb

## 2011-06-08 DIAGNOSIS — E860 Dehydration: Secondary | ICD-10-CM

## 2011-06-08 DIAGNOSIS — J329 Chronic sinusitis, unspecified: Secondary | ICD-10-CM

## 2011-06-08 DIAGNOSIS — O200 Threatened abortion: Secondary | ICD-10-CM

## 2011-06-08 HISTORY — DX: Chronic sinusitis, unspecified: J32.9

## 2011-06-08 MED ORDER — GUAIFENESIN ER 600 MG PO TB12: 600.0000 mg | ORAL_TABLET | Freq: Two times a day (BID) | ORAL | Status: DC

## 2011-06-08 MED ORDER — AMOXICILLIN 500 MG PO CAPS: 500.0000 mg | ORAL_CAPSULE | Freq: Three times a day (TID) | ORAL | Status: AC

## 2011-06-08 MED ORDER — DIPHENHYDRAMINE HCL 25 MG PO TABS: 25.0000 mg | ORAL_TABLET | Freq: Every evening | ORAL | Status: AC | PRN

## 2011-06-08 MED ORDER — SALINE SPRAY 0.65 % NA SOLN: 2.0000 | NASAL | Status: DC | PRN

## 2011-06-08 MED ORDER — LORATADINE 10 MG PO TABS: 10.0000 mg | ORAL_TABLET | Freq: Every day | ORAL | Status: DC

## 2011-06-08 NOTE — Patient Instructions (Signed)

## 2011-06-08 NOTE — Progress Notes (Signed)
[name] [id number] 1971/02/22 06/08/2011      Progress Note-Follow Up  Subjective  Chief Complaint  Chief Complaint  Patient presents with  . chest congestion    X 5 days    HPI  Patient is a 40 year old African American female who is in today with nearly a week's worth of worsening congestion and cough. She is in her second trimester of pregnancy and check with her OB office and was asked to call here. She's been having fevers to a high of 102, cough and sore throat. Ear pressure, malaise, fatigue, nasal congestion also noted. No abdominal pain vaginal bleeding or signs of pregnancy distress are noted. No GI or GU complaints. She has tried some Robitussin-DM over-the-counter and that has helped her symptoms 3 minimally. She is getting very little sleep and is eating very little. She's trying to take in some amount of fluids.  Past Medical History  Diagnosis Date  . Sacroiliitis 12/23/2010  . Heart murmur   . Ovarian cyst   . Fibroid, uterine   . Hyperemesis gravidarum   . Sinusitis 06/08/2011    Past Surgical History  Procedure Date  . Rt shoulder surgery   . Tonsillectomy and adenoidectomy     Family History  Problem Relation Age of Onset  . Anesthesia problems Neg Hx     History   Social History  . Marital Status: Married    Spouse Name: N/A    Number of Children: N/A  . Years of Education: N/A   Occupational History  . Not on file.   Social History Main Topics  . Smoking status: Never Smoker   . Smokeless tobacco: Never Used  . Alcohol Use: No  . Drug Use: No  . Sexually Active: Yes -- Female partner(s)   Other Topics Concern  . Not on file   Social History Narrative  . No narrative on file    Current Outpatient Prescriptions on File Prior to Visit  Medication Sig Dispense Refill  . promethazine (PHENERGAN) 25 MG suppository Place 25 mg rectally every 6 (six) hours as needed. nausea       . ranitidine (ZANTAC) 150 MG tablet Take 1 tablet  (150 mg total) by mouth daily.        No Known Allergies  Review of Systems  Review of Systems  Constitutional: Positive for fever, chills and malaise/fatigue.  HENT: Positive for congestion.   Eyes: Negative for discharge.  Respiratory: Positive for cough and shortness of breath. Negative for sputum production.   Cardiovascular: Negative for chest pain, palpitations and leg swelling.  Gastrointestinal: Negative for nausea, abdominal pain and diarrhea.  Genitourinary: Negative for dysuria.  Musculoskeletal: Positive for myalgias. Negative for falls.  Skin: Negative for rash.  Neurological: Positive for headaches. Negative for loss of consciousness.  Endo/Heme/Allergies: Negative for polydipsia.  Psychiatric/Behavioral: Negative for depression and suicidal ideas. The patient is not nervous/anxious and does not have insomnia.     Objective  BP 107/73  Pulse 99  Temp(Src) 98.2 F (36.8 C) (Oral)  Ht 5\' 4"  (1.626 m)  Wt 170 lb (77.111 kg)  BMI 29.18 kg/m2  SpO2 98%  LMP 01/13/2011  Physical Exam  Physical Exam  Constitutional: She is oriented to person, place, and time and well-developed, well-nourished, and in no distress. No distress.  HENT:  Head: Normocephalic and atraumatic.  Right Ear: External ear normal.  Left Ear: External ear normal.  Mouth/Throat: No oropharyngeal exudate.       TMs retracted  and dull b/l Dry mucus membranes  Eyes: Conjunctivae are normal.  Neck: Neck supple. No thyromegaly present.  Cardiovascular: Normal rate, regular rhythm and normal heart sounds.   No murmur heard. Pulmonary/Chest: Effort normal and breath sounds normal. She has no wheezes.  Abdominal: She exhibits no distension and no mass.  Musculoskeletal: She exhibits no edema.  Lymphadenopathy:    She has no cervical adenopathy.  Neurological: She is alert and oriented to person, place, and time.  Skin: Skin is warm and dry. No rash noted. She is not diaphoretic.  Psychiatric:  Memory, affect and judgment normal.    Lab Results  Component Value Date   TSH 1.10 07/17/2008   Lab Results  Component Value Date   WBC 6.8 06/01/2010   HGB 12.1 06/01/2010   HCT 35.9* 06/01/2010   MCV 93.0 06/01/2010   PLT 277 06/01/2010   Lab Results  Component Value Date   CREATININE 0.53 04/04/2011   BUN 12 04/04/2011   NA 133* 04/04/2011   K 3.3* 04/04/2011   CL 101 04/04/2011   CO2 24 04/04/2011   Lab Results  Component Value Date   ALT 14 04/04/2011   AST 14 04/04/2011   ALKPHOS 42 04/04/2011   BILITOT 0.5 04/04/2011   Lab Results  Component Value Date   CHOL 189 07/17/2008   Lab Results  Component Value Date   HDL 73.2 07/17/2008   Lab Results  Component Value Date   LDLCALC 107* 07/17/2008   Lab Results  Component Value Date   TRIG 45 07/17/2008   Lab Results  Component Value Date   CHOLHDL 2.6 CALC 07/17/2008     Assessment & Plan  Sinusitis Amoxicillin 500mg  tid due to pregnancy, she is advised to use Mucinex bid, Benadryl qhs and Loratadine in am for congestion. Ayr nasal saline at least tid. Call if not improving and may need to seek further care  Threatened abortion Patient pregnant and following with GYN, reports the pregnancy is going better at this time. She called her GYN about her current illness before calling here and was told to call our office for treatment of her acute illness. Patient reports no concerns about viability of pregnancy at this time

## 2011-06-08 NOTE — Assessment & Plan Note (Addendum)
Amoxicillin 500mg  tid due to pregnancy, she is advised to use Mucinex bid, Benadryl qhs and Loratadine in am for congestion. Ayr nasal saline at least tid. Call if not improving and may need to seek further care

## 2011-06-12 NOTE — Assessment & Plan Note (Addendum)
Patient pregnant and following with GYN, reports the pregnancy is going better at this time. She called her GYN about her current illness before calling here and was told to call our office for treatment of her acute illness. Patient reports no concerns about viability of pregnancy at this time

## 2011-07-11 NOTE — L&D Delivery Note (Signed)
Delivery Note At 3:28 PM a viable and healthy female was delivered via Vaginal, Spontaneous Delivery (Presentation: Right Occiput Anterior).  APGAR: 9, 9; weight 6 lb 1.5 oz (2764 g).   Placenta status: Intact, Spontaneous.  Cord: 3 vessels with the following complications: .  Cord pH: none  Anesthesia: Epidural  Episiotomy: None Lacerations: None Suture Repair: n/a Est. Blood Loss (mL): 200  Mom to postpartum.  Baby to nursery-stable.  [name] A 10/11/2011, 6:27 PM

## 2011-07-26 LAB — RPR: RPR: NONREACTIVE

## 2011-09-21 LAB — STREP B DNA PROBE: GBS: NEGATIVE

## 2011-10-10 ENCOUNTER — Encounter (HOSPITAL_COMMUNITY): Payer: Self-pay | Admitting: *Deleted

## 2011-10-10 ENCOUNTER — Other Ambulatory Visit: Payer: Self-pay | Admitting: Obstetrics and Gynecology

## 2011-10-10 ENCOUNTER — Telehealth (HOSPITAL_COMMUNITY): Payer: Self-pay | Admitting: *Deleted

## 2011-10-10 NOTE — Telephone Encounter (Signed)
Preadmission screen  

## 2011-10-11 ENCOUNTER — Inpatient Hospital Stay (HOSPITAL_COMMUNITY)
Admission: AD | Admit: 2011-10-11 | Discharge: 2011-10-13 | DRG: 373 | Disposition: A | Discharge status: Home / Self Care | Payer: BC Managed Care – PPO | Source: Ambulatory Visit | Attending: Obstetrics and Gynecology | Admitting: Obstetrics and Gynecology

## 2011-10-11 ENCOUNTER — Encounter (HOSPITAL_COMMUNITY): Payer: Self-pay | Admitting: Anesthesiology

## 2011-10-11 ENCOUNTER — Inpatient Hospital Stay (HOSPITAL_COMMUNITY): Payer: BC Managed Care – PPO | Admitting: Anesthesiology

## 2011-10-11 ENCOUNTER — Encounter (HOSPITAL_COMMUNITY): Payer: Self-pay | Admitting: *Deleted

## 2011-10-11 DIAGNOSIS — D62 Acute posthemorrhagic anemia: Secondary | ICD-10-CM | POA: Diagnosis not present

## 2011-10-11 DIAGNOSIS — M549 Dorsalgia, unspecified: Secondary | ICD-10-CM

## 2011-10-11 DIAGNOSIS — K529 Noninfective gastroenteritis and colitis, unspecified: Secondary | ICD-10-CM

## 2011-10-11 DIAGNOSIS — O09529 Supervision of elderly multigravida, unspecified trimester: Principal | ICD-10-CM | POA: Diagnosis present

## 2011-10-11 DIAGNOSIS — J329 Chronic sinusitis, unspecified: Secondary | ICD-10-CM

## 2011-10-11 DIAGNOSIS — R10819 Abdominal tenderness, unspecified site: Secondary | ICD-10-CM

## 2011-10-11 DIAGNOSIS — O210 Mild hyperemesis gravidarum: Secondary | ICD-10-CM

## 2011-10-11 DIAGNOSIS — O9903 Anemia complicating the puerperium: Secondary | ICD-10-CM | POA: Diagnosis not present

## 2011-10-11 DIAGNOSIS — M461 Sacroiliitis, not elsewhere classified: Secondary | ICD-10-CM

## 2011-10-11 DIAGNOSIS — N72 Inflammatory disease of cervix uteri: Secondary | ICD-10-CM

## 2011-10-11 DIAGNOSIS — Z8719 Personal history of other diseases of the digestive system: Secondary | ICD-10-CM

## 2011-10-11 DIAGNOSIS — M25569 Pain in unspecified knee: Secondary | ICD-10-CM

## 2011-10-11 HISTORY — DX: Gastro-esophageal reflux disease without esophagitis: K21.9

## 2011-10-11 LAB — CBC
HCT: 36.7 % (ref 36.0–46.0)
Hemoglobin: 12 g/dL (ref 12.0–15.0)
MCH: 31.5 pg (ref 26.0–34.0)
MCHC: 32.7 g/dL (ref 30.0–36.0)
MCV: 96.3 fL (ref 78.0–100.0)
RDW: 14.1 % (ref 11.5–15.5)

## 2011-10-11 MED ORDER — CITRIC ACID-SODIUM CITRATE 334-500 MG/5ML PO SOLN: 30.0000 mL | ORAL | Status: DC | PRN

## 2011-10-11 MED ORDER — OXYTOCIN 10 UNIT/ML IJ SOLN: 10.0000 [IU] | Freq: Once | INTRAMUSCULAR | Status: DC

## 2011-10-11 MED ORDER — ONDANSETRON HCL 4 MG/2ML IJ SOLN: 4.0000 mg | Freq: Four times a day (QID) | INTRAMUSCULAR | Status: DC | PRN

## 2011-10-11 MED ORDER — EPHEDRINE 5 MG/ML INJ
10.0000 mg | INTRAVENOUS | Status: DC | PRN
  Filled 2011-10-11: qty 4

## 2011-10-11 MED ORDER — LANOLIN HYDROUS EX OINT: TOPICAL_OINTMENT | CUTANEOUS | Status: DC | PRN

## 2011-10-11 MED ORDER — FAMOTIDINE 20 MG PO TABS
20.0000 mg | ORAL_TABLET | Freq: Every day | ORAL | Status: DC | PRN
  Administered 2011-10-11: 20 mg via ORAL
  Filled 2011-10-11: qty 1

## 2011-10-11 MED ORDER — FENTANYL 2.5 MCG/ML BUPIVACAINE 1/10 % EPIDURAL INFUSION (WH - ANES)
INTRAMUSCULAR | Status: DC | PRN
  Administered 2011-10-11: 14 mL/h via EPIDURAL

## 2011-10-11 MED ORDER — ACETAMINOPHEN 325 MG PO TABS: 650.0000 mg | ORAL_TABLET | ORAL | Status: DC | PRN

## 2011-10-11 MED ORDER — EPHEDRINE 5 MG/ML INJ: 10.0000 mg | INTRAVENOUS | Status: DC | PRN

## 2011-10-11 MED ORDER — LACTATED RINGERS IV SOLN: 500.0000 mL | INTRAVENOUS | Status: DC | PRN

## 2011-10-11 MED ORDER — OXYTOCIN BOLUS FROM INFUSION
500.0000 mL | Freq: Once | INTRAVENOUS | Status: DC
  Filled 2011-10-11: qty 500

## 2011-10-11 MED ORDER — ONDANSETRON HCL 4 MG/2ML IJ SOLN: 4.0000 mg | INTRAMUSCULAR | Status: DC | PRN

## 2011-10-11 MED ORDER — DIPHENHYDRAMINE HCL 25 MG PO CAPS: 25.0000 mg | ORAL_CAPSULE | Freq: Four times a day (QID) | ORAL | Status: DC | PRN

## 2011-10-11 MED ORDER — SIMETHICONE 80 MG PO CHEW: 80.0000 mg | CHEWABLE_TABLET | ORAL | Status: DC | PRN

## 2011-10-11 MED ORDER — WITCH HAZEL-GLYCERIN EX PADS: 1.0000 "application " | MEDICATED_PAD | CUTANEOUS | Status: DC | PRN

## 2011-10-11 MED ORDER — SODIUM BICARBONATE 8.4 % IV SOLN
INTRAVENOUS | Status: DC | PRN
  Administered 2011-10-11: 4 mL via EPIDURAL

## 2011-10-11 MED ORDER — TETANUS-DIPHTH-ACELL PERTUSSIS 5-2.5-18.5 LF-MCG/0.5 IM SUSP
0.5000 mL | Freq: Once | INTRAMUSCULAR | Status: AC
  Administered 2011-10-12: 0.5 mL via INTRAMUSCULAR
  Filled 2011-10-11: qty 0.5

## 2011-10-11 MED ORDER — OXYTOCIN 20 UNITS IN LACTATED RINGERS INFUSION - SIMPLE
1.0000 m[IU]/min | INTRAVENOUS | Status: DC
  Administered 2011-10-11: 2 m[IU]/min via INTRAVENOUS
  Filled 2011-10-11: qty 1000

## 2011-10-11 MED ORDER — DIBUCAINE 1 % RE OINT: 1.0000 "application " | TOPICAL_OINTMENT | RECTAL | Status: DC | PRN

## 2011-10-11 MED ORDER — ZOLPIDEM TARTRATE 5 MG PO TABS: 5.0000 mg | ORAL_TABLET | Freq: Every evening | ORAL | Status: DC | PRN

## 2011-10-11 MED ORDER — MEASLES, MUMPS & RUBELLA VAC ~~LOC~~ INJ
0.5000 mL | INJECTION | Freq: Once | SUBCUTANEOUS | Status: DC
  Filled 2011-10-11: qty 0.5

## 2011-10-11 MED ORDER — BENZOCAINE-MENTHOL 20-0.5 % EX AERO
INHALATION_SPRAY | CUTANEOUS | Status: AC
  Filled 2011-10-11: qty 56

## 2011-10-11 MED ORDER — LIDOCAINE HCL (PF) 1 % IJ SOLN: 30.0000 mL | INTRAMUSCULAR | Status: DC | PRN

## 2011-10-11 MED ORDER — LACTATED RINGERS IV SOLN
500.0000 mL | Freq: Once | INTRAVENOUS | Status: AC
  Administered 2011-10-11: 500 mL via INTRAVENOUS

## 2011-10-11 MED ORDER — IBUPROFEN 600 MG PO TABS
600.0000 mg | ORAL_TABLET | Freq: Four times a day (QID) | ORAL | Status: DC
  Administered 2011-10-11 – 2011-10-13 (×7): 600 mg via ORAL
  Filled 2011-10-11 (×9): qty 1

## 2011-10-11 MED ORDER — DIPHENHYDRAMINE HCL 50 MG/ML IJ SOLN: 12.5000 mg | INTRAMUSCULAR | Status: DC | PRN

## 2011-10-11 MED ORDER — BENZOCAINE-MENTHOL 20-0.5 % EX AERO: 1.0000 "application " | INHALATION_SPRAY | CUTANEOUS | Status: DC | PRN

## 2011-10-11 MED ORDER — ONDANSETRON HCL 4 MG PO TABS: 4.0000 mg | ORAL_TABLET | ORAL | Status: DC | PRN

## 2011-10-11 MED ORDER — OXYCODONE-ACETAMINOPHEN 5-325 MG PO TABS: 1.0000 | ORAL_TABLET | ORAL | Status: DC | PRN

## 2011-10-11 MED ORDER — FENTANYL 2.5 MCG/ML BUPIVACAINE 1/10 % EPIDURAL INFUSION (WH - ANES)
14.0000 mL/h | INTRAMUSCULAR | Status: DC
  Filled 2011-10-11: qty 60

## 2011-10-11 MED ORDER — FERROUS SULFATE 325 (65 FE) MG PO TABS
325.0000 mg | ORAL_TABLET | Freq: Two times a day (BID) | ORAL | Status: DC
  Administered 2011-10-11 – 2011-10-13 (×4): 325 mg via ORAL
  Filled 2011-10-11 (×4): qty 1

## 2011-10-11 MED ORDER — LACTATED RINGERS IV SOLN
INTRAVENOUS | Status: DC
  Administered 2011-10-11: 13:00:00 via INTRAVENOUS

## 2011-10-11 MED ORDER — IBUPROFEN 600 MG PO TABS: 600.0000 mg | ORAL_TABLET | Freq: Four times a day (QID) | ORAL | Status: DC | PRN

## 2011-10-11 MED ORDER — PRENATAL MULTIVITAMIN CH
1.0000 | ORAL_TABLET | Freq: Every day | ORAL | Status: DC
  Administered 2011-10-12 – 2011-10-13 (×2): 1 via ORAL
  Filled 2011-10-11 (×2): qty 1

## 2011-10-11 MED ORDER — TERBUTALINE SULFATE 1 MG/ML IJ SOLN: 0.2500 mg | Freq: Once | INTRAMUSCULAR | Status: DC | PRN

## 2011-10-11 MED ORDER — SENNOSIDES-DOCUSATE SODIUM 8.6-50 MG PO TABS
2.0000 | ORAL_TABLET | Freq: Every day | ORAL | Status: DC
  Administered 2011-10-11 – 2011-10-12 (×2): 2 via ORAL

## 2011-10-11 MED ORDER — OXYCODONE-ACETAMINOPHEN 5-325 MG PO TABS
1.0000 | ORAL_TABLET | ORAL | Status: DC | PRN
  Administered 2011-10-11 – 2011-10-12 (×2): 1 via ORAL
  Filled 2011-10-11 (×2): qty 1
  Filled 2011-10-11: qty 2

## 2011-10-11 MED ORDER — OXYTOCIN 20 UNITS IN LACTATED RINGERS INFUSION - SIMPLE: 125.0000 mL/h | Freq: Once | INTRAVENOUS | Status: DC

## 2011-10-11 MED ORDER — PHENYLEPHRINE 40 MCG/ML (10ML) SYRINGE FOR IV PUSH (FOR BLOOD PRESSURE SUPPORT): 80.0000 ug | PREFILLED_SYRINGE | INTRAVENOUS | Status: DC | PRN

## 2011-10-11 MED ORDER — PHENYLEPHRINE 40 MCG/ML (10ML) SYRINGE FOR IV PUSH (FOR BLOOD PRESSURE SUPPORT)
80.0000 ug | PREFILLED_SYRINGE | INTRAVENOUS | Status: DC | PRN
  Filled 2011-10-11: qty 5

## 2011-10-11 NOTE — Progress Notes (Signed)
Foley dc'd in L&D.

## 2011-10-11 NOTE — Anesthesia Procedure Notes (Signed)

## 2011-10-11 NOTE — Anesthesia Preprocedure Evaluation (Signed)
Anesthesia Evaluation  Patient identified by MRN, date of birth, ID band Patient awake    Reviewed: Allergy & Precautions, H&P , Patient's Chart, lab work & pertinent test results  Airway Mallampati: II  TM Distance: >3 FB Neck ROM: full    Dental  (+) Teeth Intact   Pulmonary    breath sounds clear to auscultation       Cardiovascular  Rhythm:regular Rate:Normal     Neuro/Psych    GI/Hepatic GERD  Medicated,  Endo/Other    Renal/GU      Musculoskeletal   Abdominal   Peds  Hematology   Anesthesia Other Findings       Reproductive/Obstetrics (+) Pregnancy                             Anesthesia Physical Anesthesia Plan  ASA: II  Anesthesia Plan: Epidural   Post-op Pain Management:    Induction:   Airway Management Planned:   Additional Equipment:   Intra-op Plan:   Post-operative Plan:   Informed Consent: I have reviewed the patients History and Physical, chart, labs and discussed the procedure including the risks, benefits and alternatives for the proposed anesthesia with the patient or authorized representative who has indicated his/her understanding and acceptance.   Dental Advisory Given  Plan Discussed with:   Anesthesia Plan Comments: (Labs checked- platelets confirmed with RN in room. Fetal heart tracing, per RN, reported to be stable enough for sitting procedure. Discussed epidural, and patient consents to the procedure:  included risk of possible headache,backache, failed block, allergic reaction, and nerve injury. This patient was asked if she had any questions or concerns before the procedure started.)        Anesthesia Quick Evaluation  

## 2011-10-11 NOTE — H&P (Signed)
[name] is a 41 y.o. female presenting for admission @ 91 5 /[redacted] wk gestation due to ctx and heavy vaginal bleeding (+) FM  Maternal Medical History:  Reason for admission: Reason for admission: contractions and vaginal bleeding.  Contractions: Onset was 1-2 hours ago.   Frequency: irregular.   Perceived severity is moderate.    Fetal activity: Perceived fetal activity is normal.    Prenatal Complications - Diabetes: none.    OB History    Grav Para Term Preterm Abortions TAB SAB Ect Mult Living   4 3 1 2      3      Past Medical History  Diagnosis Date  . Sacroiliitis 12/23/2010  . Heart murmur   . Ovarian cyst   . Fibroid, uterine   . Hyperemesis gravidarum   . Sinusitis 06/08/2011  . AMA (advanced maternal age) multigravida 35+   . Pregnancy with history of pre-term labor   . Obesity   . Headache   . Dysrhythmia     palpitations  . History of chicken pox   . Uterine fibroid   . GERD (gastroesophageal reflux disease)    Past Surgical History  Procedure Date  . Rt shoulder surgery   . Tonsillectomy and adenoidectomy   . Tonsillectomy   . Laparoscopy     adhesions   Family History: family history includes Cancer in her maternal grandmother; Diabetes in her father and sister; Hypertension in her father and sister; and Hypothyroidism in her mother.  There is no history of Anesthesia problems. Social History:  reports that she has never smoked. She has never used smokeless tobacco. She reports that she does not drink alcohol or use illicit drugs.  Review of Systems  Genitourinary:       Vaginal bleeding  All other systems reviewed and are negative.    Dilation: 4 Effacement (%): 50 Station: -2 Exam by:: Dr. Dr. Cherly Hensen Blood pressure 119/75, pulse 81, temperature 98.1 F (36.7 C), temperature source Oral, resp. rate 18, last menstrual period 01/13/2011. Maternal Exam:  Uterine Assessment: Contraction strength is moderate.  Contraction frequency is  irregular.   Abdomen: Patient reports no abdominal tenderness. Fundal height is term.   Estimated fetal weight is 7lb.   Fetal presentation: vertex  Introitus: Normal vulva. Ferning test: not done.  Nitrazine test: not done.  Pelvis: adequate for delivery.   Cervix: Cervix evaluated by digital exam.    4/60/-3 (+)bloody Physical Exam  Constitutional: She appears well-developed and well-nourished.  HENT:  Head: Normocephalic.  Eyes: EOM are normal.  Neck: Neck supple.  Cardiovascular: Regular rhythm.   Respiratory: Effort normal.  GI: Soft.  Musculoskeletal: She exhibits no edema.  Skin: Skin is warm and dry.  Psychiatric: She has a normal mood and affect.   Tracing: baseline 140 (+) accels ctx q 2-4 mins Prenatal labs: ABO, Rh: --/--/O NEG (09/30 2342) Antibody: NEG (09/30 2342) Rubella: Immune (09/11 0000) RPR: Nonreactive (01/16 0000)  HBsAg: Negative (09/11 0000)  HIV: Non-reactive (09/11 0000)  GBS: Negative (03/14 0000)   Assessment/Plan: 3rd vaginal bleeding ? Heavy show IUP @ 38 5/7. Rh neg  P) admit routine labs epidural low dose pitocin  [name] A 10/11/2011, 8:40 AM

## 2011-10-11 NOTE — Progress Notes (Signed)
Patient request to use her own medication for heartburn. Informed her we need to call the MD to get an order for it. Dr. Ernestina Penna called and order received. Formulary substitution per pharmacy. Will continue to monitor patient.

## 2011-10-11 NOTE — Progress Notes (Signed)
Right lower extremity still a  Little numb

## 2011-10-11 NOTE — Progress Notes (Signed)
[name] is a 41 y.o. [id number] at [redacted]w[redacted]d by LMP admitted for active labor  Subjective: Chief Complaint  Patient presents with  . Contractions    Objective: BP 127/76  Pulse 71  Temp(Src) 97.8 F (36.6 C) (Oral)  Resp 20  Ht 5\' 4"  (1.626 m)  SpO2 99%  LMP 01/13/2011  Breastfeeding? Unknown      FHT:  FHR: 140 bpm, variability: moderate,  accelerations:  Present,  decelerations:  Absent UC:   regular, every 2-3 minutes SVE:   8/80/-2 bloody show asynclytic Labs: Lab Results  Component Value Date   WBC 9.1 10/11/2011   HGB 12.0 10/11/2011   HCT 36.7 10/11/2011   MCV 96.3 10/11/2011   PLT 231 10/11/2011    Assessment / Plan: active phase  P) exaggerated right sims. Continue pitocin Anticipated MOD:  NSVD  [name] A 10/11/2011, 4:23 PM

## 2011-10-12 LAB — CBC
Hemoglobin: 10.5 g/dL — ABNORMAL LOW (ref 12.0–15.0)
RBC: 3.31 MIL/uL — ABNORMAL LOW (ref 3.87–5.11)
WBC: 10.9 10*3/uL — ABNORMAL HIGH (ref 4.0–10.5)

## 2011-10-12 MED ORDER — RHO D IMMUNE GLOBULIN 1500 UNIT/2ML IJ SOLN
300.0000 ug | Freq: Once | INTRAMUSCULAR | Status: AC
  Administered 2011-10-12: 300 ug via INTRAMUSCULAR
  Filled 2011-10-12: qty 2

## 2011-10-12 NOTE — Anesthesia Postprocedure Evaluation (Signed)
  Anesthesia Post-op Note  Patient: [name]  Procedure(s) Performed: * No surgery found *  Patient Location: Women's Unit  Anesthesia Type: Epidural  Level of Consciousness: awake  Airway and Oxygen Therapy: Patient Spontanous Breathing  Post-op Pain: none  Post-op Assessment: Patient's Cardiovascular Status Stable, Respiratory Function Stable, Patent Airway, No signs of Nausea or vomiting, Adequate PO intake, Pain level controlled, No headache, No backache, No residual numbness and No residual motor weakness  Post-op Vital Signs: Reviewed and stable  Complications: No apparent anesthesia complications

## 2011-10-12 NOTE — Progress Notes (Signed)
Patient ID: [name], female   DOB: Feb 13, 1971, 41 y.o.   MRN: [id number]  PPD 1 SVD  S:  Reports feeling well - tired             Tolerating po/ No nausea or vomiting             Bleeding is moderate             Pain controlled withmotrin and percocet             Up ad lib / ambulatory  Newborn breast feeding  / Circumcision planned   O:  A & O x 3 NAD             VS: Blood pressure 114/78, pulse 78, temperature 98 F (36.7 C), temperature source Oral, resp. rate 18, height 5\' 4"  (1.626 m), last menstrual period 01/13/2011, SpO2 98.00%, unknown if currently breastfeeding.  LABS: WBC/Hgb/Hct/Plts:  10.9/10.5/32.2/193 (04/04 0530)   Lungs: Clear and unlabored  Heart: regular rate and rhythm / no mumurs  Abdomen: soft, non-tender, non-distended              Fundus: firm, non-tender, U-1  Perineum: mild edema - ice pack in place  Lochia: ligt  Extremities: trace edema, no calf pain or tenderness    A: PPD # 1   Doing well - stable status  P:  Routine post partum orders   Marlinda Mike 10/12/2011, 9:16 AM

## 2011-10-13 LAB — RH IG WORKUP (INCLUDES ABO/RH)
ABO/RH(D): O NEG
Antibody Screen: NEGATIVE
Gestational Age(Wks): 38.5

## 2011-10-13 MED ORDER — FERRALET 90 90-1 MG PO TABS: 1.0000 | ORAL_TABLET | Freq: Every day | ORAL | Status: DC

## 2011-10-13 MED ORDER — IBUPROFEN 600 MG PO TABS: 600.0000 mg | ORAL_TABLET | Freq: Four times a day (QID) | ORAL | Status: AC

## 2011-10-13 MED ORDER — OXYCODONE-ACETAMINOPHEN 5-325 MG PO TABS: 1.0000 | ORAL_TABLET | Freq: Four times a day (QID) | ORAL | Status: AC | PRN

## 2011-10-13 NOTE — Discharge Summary (Signed)
Obstetric Discharge Summary Reason for Admission: onset of labor Prenatal Procedures: NST and ultrasound Intrapartum Procedures: spontaneous vaginal delivery Postpartum Procedures: Rho(D) Ig Complications-Operative and Postpartum: none Hemoglobin  Date Value Range Status  10/12/2011 10.5* 12.0-15.0 (g/dL) Final     HCT  Date Value Range Status  10/12/2011 32.2* 36.0-46.0 (%) Final    Physical Exam:  General: alert, cooperative and no distress Lochia: appropriate Uterine Fundus: firm Incision: n/a DVT Evaluation: No evidence of DVT seen on physical exam.  Discharge Diagnoses: Term Pregnancy-delivered  Discharge Information: Date: 10/13/2011 Activity: pelvic rest Diet: routine Medications: PNV, Ibuprofen, Iron and Percocet Condition: stable Instructions: refer to practice specific booklet Discharge to: home Follow-up Information    Follow up with [name],[name], MD in 6 weeks.   Contact information:   [address] [place] [address] [phone]          Newborn Data: Live born female on 10/11/11 Birth Weight: 6 lb 1.5 oz (2764 g) APGAR: 9, 9  Home with mother.  [name] K 10/13/2011, 8:50 AM

## 2011-10-13 NOTE — Progress Notes (Signed)
Patient ID: [name], female   DOB: 03-19-1971, 41 y.o.   MRN: [id number] PPD # 2  Subjective: Pt reports feeling well and eager for d/c home/ Pain controlled with motrin and percocet Tolerating po/ Voiding without problems/ No n/v Bleeding is light/ Newborn info:  Information for the patient's newborn:  Carlen, Rebuck [[id number]]  female  / circ complete, per Dr [name]/ Feeding: breast    Objective:  VS: Blood pressure 118/82, pulse 80, temperature 98.4 F (36.9 C), temperature source Oral, resp. rate 18    Basename 10/12/11 0530 10/11/11 0930  WBC 10.9* 9.1  HGB 10.5* 12.0  HCT 32.2* 36.7  PLT 193 231    Blood type: --/--/O NEG (04/04 0530) Rubella: Immune (09/11 0000)    Physical Exam:  General: A & O x 3  alert, cooperative and no distress CV: Regular rate and rhythm Resp: clear Abdomen: soft, nontender, normal bowel sounds Uterine Fundus: firm, below umbilicus, nontender Perineum: not inspected Lochia: minimal Ext: Homans sign is negative, no sign of DVT    A/P: PPD # 2/ [id number]/ S/P:spontaneous vaginal ABL Anemia Doing well and stable for discharge home RX: Ibuprofen 600mg  po Q 6 hrs prn pain #30 Refill x 1 Niferex 150mg  po QD/BID #30/#60 Refill x 1 Percocet 5/325 1 to 2 po Q 4 hrs prn pain #15 No refill WOB/GYN booklet given Routine pp visit in 6wks   Demetrius Revel, MSN, [facility] 10/13/2011, 8:42 AM

## 2011-10-15 ENCOUNTER — Inpatient Hospital Stay (HOSPITAL_COMMUNITY): Admission: RE | Admit: 2011-10-15 | Payer: BC Managed Care – PPO | Source: Ambulatory Visit

## 2012-12-27 ENCOUNTER — Ambulatory Visit (INDEPENDENT_AMBULATORY_CARE_PROVIDER_SITE_OTHER): Payer: BC Managed Care – PPO | Admitting: Family Medicine

## 2012-12-27 ENCOUNTER — Encounter: Payer: Self-pay | Admitting: Family Medicine

## 2012-12-27 VITALS — BP 110/70 | HR 88 | Temp 98.5°F | Ht 65.0 in | Wt 166.0 lb

## 2012-12-27 DIAGNOSIS — K047 Periapical abscess without sinus: Secondary | ICD-10-CM

## 2012-12-27 MED ORDER — HYDROCODONE-ACETAMINOPHEN 5-325MG PREPACK (~~LOC~~: ORAL_TABLET | ORAL | Status: DC

## 2012-12-27 MED ORDER — AMOXICILLIN-POT CLAVULANATE 875-125 MG PO TABS: 1.0000 | ORAL_TABLET | Freq: Two times a day (BID) | ORAL | Status: DC

## 2012-12-27 NOTE — Progress Notes (Signed)
  Subjective:    Patient ID: [name], female    DOB: [birth year], 42 y.o.   MRN: [id number]  HPI Pt here c/o pain in upper gums.  She is concerned about a tooth abscess but her dentist is closed today. Her L cheek is swollen and tender.  No fever   Review of Systems As above    Objective:   Physical Exam  BP 110/70  Pulse 88  Temp(Src) 98.5 F (36.9 C) (Oral)  Ht 5\' 5"  (1.651 m)  Wt 166 lb (75.297 kg)  BMI 27.62 kg/m2  SpO2 97%  LMP 12/23/2012  Breastfeeding? No General appearance: alert, cooperative, appears stated age and no distress Throat: + swelling of gums and soft tissues L side mouth Face--+ L cheek swollen and warm       Assessment & Plan:

## 2012-12-27 NOTE — Assessment & Plan Note (Signed)
augmentin x 10 days  vicodin for pain F/u with dentist asap

## 2012-12-27 NOTE — Patient Instructions (Signed)
Abscessed Tooth A tooth abscess is a collection of infected fluid (pus) from a bacterial infection in the inner part of the tooth (pulp). It usually occurs at the end of the tooth's root.  CAUSES   A very bad cavity (extensive tooth decay).   Trauma to the tooth, such as a broken or chipped tooth, that allows bacteria to enter into the pulp.  SYMPTOMS  Severe pain in and around the infected tooth.   Swelling and redness around the abscessed tooth or in the mouth or face.   Tenderness.   Pus drainage.   Bad breath.   Bitter taste in the mouth.   Difficulty swallowing.   Difficulty opening the mouth.   Feeling sick to your stomach (nauseous).   Vomiting.   Chills.   Swollen neck glands.  DIAGNOSIS  A medical and dental history will be taken.   An examination will be performed by tapping on the abscessed tooth.   X-rays may be taken of the tooth to identify the abscess.  TREATMENT The goal of treatment is to eliminate the infection.   You may be prescribed antibiotic medicine to stop the infection from spreading.   A root canal may be performed to save the tooth. If the tooth cannot be saved, it may be pulled (extracted) and the abscess may be drained.  HOME CARE INSTRUCTIONS  Only take over-the-counter or prescription medicines for pain, fever, or discomfort as directed by your caregiver.   Do not drive after taking pain medicine (narcotics).   Rinse your mouth (gargle) often with salt water ( tsp salt in 8 oz of warm water) to relieve pain or swelling.   Do not apply heat to the outside of your face.   Return to your dentist for further treatment as directed.  SEEK IMMEDIATE DENTAL CARE IF:  You have a temperature by mouth above 102 F (38.9 C), not controlled by medicine.   You have chills or a very bad headache.   You have problems breathing or swallowing.   Your have trouble opening your mouth.   You develop swelling in the neck or around the eye.    Your pain is not helped by medicine.   Your pain is getting worse instead of better.  Document Released: 06/26/2005 Document Revised: 06/15/2011 Document Reviewed: 10/04/2010 [employer] Patient Information 2012 [employer]. 

## 2013-05-15 ENCOUNTER — Other Ambulatory Visit: Payer: Self-pay

## 2014-02-06 ENCOUNTER — Ambulatory Visit (HOSPITAL_BASED_OUTPATIENT_CLINIC_OR_DEPARTMENT_OTHER)
Admission: RE | Admit: 2014-02-06 | Discharge: 2014-02-06 | Disposition: A | Discharge status: Home / Self Care | Payer: BC Managed Care – PPO | Source: Ambulatory Visit | Attending: Obstetrics and Gynecology | Admitting: Obstetrics and Gynecology

## 2014-02-06 ENCOUNTER — Other Ambulatory Visit (HOSPITAL_BASED_OUTPATIENT_CLINIC_OR_DEPARTMENT_OTHER): Payer: Self-pay | Admitting: Obstetrics and Gynecology

## 2014-02-06 DIAGNOSIS — Z1231 Encounter for screening mammogram for malignant neoplasm of breast: Secondary | ICD-10-CM

## 2014-05-11 ENCOUNTER — Encounter: Payer: Self-pay | Admitting: Family Medicine

## 2014-09-28 ENCOUNTER — Encounter (HOSPITAL_BASED_OUTPATIENT_CLINIC_OR_DEPARTMENT_OTHER): Payer: Self-pay | Admitting: *Deleted

## 2014-09-28 ENCOUNTER — Emergency Department (HOSPITAL_BASED_OUTPATIENT_CLINIC_OR_DEPARTMENT_OTHER): Payer: BC Managed Care – PPO

## 2014-09-28 ENCOUNTER — Emergency Department (HOSPITAL_BASED_OUTPATIENT_CLINIC_OR_DEPARTMENT_OTHER)
Admission: EM | Admit: 2014-09-28 | Discharge: 2014-09-28 | Disposition: A | Discharge status: Home / Self Care | Payer: BC Managed Care – PPO | Attending: Emergency Medicine | Admitting: Emergency Medicine

## 2014-09-28 DIAGNOSIS — M25551 Pain in right hip: Secondary | ICD-10-CM

## 2014-09-28 DIAGNOSIS — Z792 Long term (current) use of antibiotics: Secondary | ICD-10-CM | POA: Diagnosis not present

## 2014-09-28 DIAGNOSIS — Y9389 Activity, other specified: Secondary | ICD-10-CM | POA: Diagnosis not present

## 2014-09-28 DIAGNOSIS — S9001XA Contusion of right ankle, initial encounter: Secondary | ICD-10-CM | POA: Diagnosis not present

## 2014-09-28 DIAGNOSIS — Z8709 Personal history of other diseases of the respiratory system: Secondary | ICD-10-CM | POA: Diagnosis not present

## 2014-09-28 DIAGNOSIS — E669 Obesity, unspecified: Secondary | ICD-10-CM | POA: Insufficient documentation

## 2014-09-28 DIAGNOSIS — Y92410 Unspecified street and highway as the place of occurrence of the external cause: Secondary | ICD-10-CM | POA: Insufficient documentation

## 2014-09-28 DIAGNOSIS — Z8742 Personal history of other diseases of the female genital tract: Secondary | ICD-10-CM | POA: Diagnosis not present

## 2014-09-28 DIAGNOSIS — S79911A Unspecified injury of right hip, initial encounter: Secondary | ICD-10-CM | POA: Insufficient documentation

## 2014-09-28 DIAGNOSIS — Z8619 Personal history of other infectious and parasitic diseases: Secondary | ICD-10-CM | POA: Diagnosis not present

## 2014-09-28 DIAGNOSIS — S39012A Strain of muscle, fascia and tendon of lower back, initial encounter: Secondary | ICD-10-CM | POA: Insufficient documentation

## 2014-09-28 DIAGNOSIS — Z8719 Personal history of other diseases of the digestive system: Secondary | ICD-10-CM | POA: Diagnosis not present

## 2014-09-28 DIAGNOSIS — Y998 Other external cause status: Secondary | ICD-10-CM | POA: Insufficient documentation

## 2014-09-28 DIAGNOSIS — Z3202 Encounter for pregnancy test, result negative: Secondary | ICD-10-CM | POA: Insufficient documentation

## 2014-09-28 DIAGNOSIS — S92341A Displaced fracture of fourth metatarsal bone, right foot, initial encounter for closed fracture: Secondary | ICD-10-CM | POA: Insufficient documentation

## 2014-09-28 DIAGNOSIS — S3992XA Unspecified injury of lower back, initial encounter: Secondary | ICD-10-CM | POA: Diagnosis present

## 2014-09-28 DIAGNOSIS — S92301A Fracture of unspecified metatarsal bone(s), right foot, initial encounter for closed fracture: Secondary | ICD-10-CM

## 2014-09-28 DIAGNOSIS — R011 Cardiac murmur, unspecified: Secondary | ICD-10-CM | POA: Diagnosis not present

## 2014-09-28 LAB — URINALYSIS, ROUTINE W REFLEX MICROSCOPIC
BILIRUBIN URINE: NEGATIVE
Glucose, UA: NEGATIVE mg/dL
KETONES UR: NEGATIVE mg/dL
Leukocytes, UA: NEGATIVE
NITRITE: NEGATIVE
PROTEIN: NEGATIVE mg/dL
Specific Gravity, Urine: 1.01 (ref 1.005–1.030)
UROBILINOGEN UA: 0.2 mg/dL (ref 0.0–1.0)
pH: 6 (ref 5.0–8.0)

## 2014-09-28 LAB — URINE MICROSCOPIC-ADD ON

## 2014-09-28 LAB — PREGNANCY, URINE: Preg Test, Ur: NEGATIVE

## 2014-09-28 MED ORDER — IBUPROFEN 800 MG PO TABS: 800.0000 mg | ORAL_TABLET | Freq: Three times a day (TID) | ORAL | Status: DC

## 2014-09-28 MED ORDER — CYCLOBENZAPRINE HCL 10 MG PO TABS: 10.0000 mg | ORAL_TABLET | Freq: Two times a day (BID) | ORAL | Status: DC | PRN

## 2014-09-28 MED ORDER — IBUPROFEN 800 MG PO TABS
800.0000 mg | ORAL_TABLET | Freq: Once | ORAL | Status: AC
  Administered 2014-09-28: 800 mg via ORAL
  Filled 2014-09-28: qty 1

## 2014-09-28 NOTE — ED Notes (Signed)
Patient transported to X-ray 
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
## 2018-07-17 ENCOUNTER — Emergency Department (HOSPITAL_BASED_OUTPATIENT_CLINIC_OR_DEPARTMENT_OTHER): Payer: BC Managed Care – PPO

## 2018-07-17 ENCOUNTER — Encounter (HOSPITAL_BASED_OUTPATIENT_CLINIC_OR_DEPARTMENT_OTHER): Payer: Self-pay | Admitting: Emergency Medicine

## 2018-07-17 ENCOUNTER — Emergency Department (HOSPITAL_BASED_OUTPATIENT_CLINIC_OR_DEPARTMENT_OTHER)
Admission: EM | Admit: 2018-07-17 | Discharge: 2018-07-17 | Disposition: A | Discharge status: Home / Self Care | Payer: BC Managed Care – PPO | Attending: Emergency Medicine | Admitting: Emergency Medicine

## 2018-07-17 ENCOUNTER — Other Ambulatory Visit: Payer: Self-pay

## 2018-07-17 DIAGNOSIS — Y999 Unspecified external cause status: Secondary | ICD-10-CM | POA: Insufficient documentation

## 2018-07-17 DIAGNOSIS — Y93A9 Activity, other involving cardiorespiratory exercise: Secondary | ICD-10-CM | POA: Insufficient documentation

## 2018-07-17 DIAGNOSIS — Y9289 Other specified places as the place of occurrence of the external cause: Secondary | ICD-10-CM | POA: Diagnosis not present

## 2018-07-17 DIAGNOSIS — W1789XA Other fall from one level to another, initial encounter: Secondary | ICD-10-CM | POA: Diagnosis not present

## 2018-07-17 DIAGNOSIS — S43491A Other sprain of right shoulder joint, initial encounter: Secondary | ICD-10-CM | POA: Insufficient documentation

## 2018-07-17 DIAGNOSIS — S40011A Contusion of right shoulder, initial encounter: Secondary | ICD-10-CM | POA: Insufficient documentation

## 2018-07-17 DIAGNOSIS — S43401A Unspecified sprain of right shoulder joint, initial encounter: Secondary | ICD-10-CM

## 2018-07-17 DIAGNOSIS — S4991XA Unspecified injury of right shoulder and upper arm, initial encounter: Secondary | ICD-10-CM | POA: Diagnosis present

## 2018-07-17 MED ORDER — HYDROCODONE-ACETAMINOPHEN 5-325 MG PO TABS: 1.0000 | ORAL_TABLET | Freq: Four times a day (QID) | ORAL | 0 refills | Status: DC | PRN

## 2018-07-17 NOTE — ED Notes (Signed)
Pt waiting for ride home.

## 2018-07-17 NOTE — ED Provider Notes (Signed)
[place] EMERGENCY DEPARTMENT Provider Note   CSN: [id number] Arrival date & time: 07/17/18  2376     History   Chief Complaint Chief Complaint  Patient presents with  . Shoulder Injury    HPI [name] is a 48 y.o. female.  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  She presents with complaints of shoulder pain.  She was running on the treadmill this morning when she fell and landed on her shoulder.  She denies any numbness or tingling.  She denies other injury.  The history is provided by the patient.  Shoulder Injury  This is a new problem. The current episode started less than 1 hour ago. The problem occurs constantly. The problem has not changed since onset.Exacerbated by: Movement and palpation. Nothing relieves the symptoms. She has tried nothing for the symptoms.    Past Medical History:  Diagnosis Date  . AMA (advanced maternal age) multigravida 53+   . Dysrhythmia    palpitations  . Fibroid, uterine   . GERD (gastroesophageal reflux disease)   . Headache(784.0)   . Heart murmur   . History of chicken pox   . Hyperemesis gravidarum   . Obesity   . Ovarian cyst   . Pregnancy with history of pre-term labor   . Sacroiliitis ([place]) 12/23/2010  . Sinusitis 06/08/2011  . Uterine fibroid     Patient Active Problem List   Diagnosis Date Noted  . Tooth abscess 12/27/2012  . Postpartum care following vaginal delivery (4/3) 10/12/2011  . Sinusitis 06/08/2011  . Acute gastroenteritis 06/06/2011  . Threatened abortion 04/09/2011  . Hyperemesis complicating pregnancy, antepartum 03/14/2011    Class: Acute  . Sacroiliitis ([place]) 12/23/2010  . CERVICITIS, ACUTE 06/07/2010  . ABDOMINAL TENDERNESS UNSPECIFIED SITE 06/01/2010  . BACK PAIN 01/25/2010  . KNEE PAIN, RIGHT 07/16/2008  . GASTROESOPHAGEAL REFLUX DISEASE, HX OF 07/16/2008    Past Surgical History:  Procedure  Laterality Date  . LAPAROSCOPY     adhesions  . rt shoulder surgery    . TONSILLECTOMY    . TONSILLECTOMY AND ADENOIDECTOMY       OB History    Gravida  4   Para  4   Term  2   Preterm  2   AB      Living  4     SAB      TAB      Ectopic      Multiple  1   Live Births  4            Home Medications    Prior to Admission medications   Not on File    Family History Family History  Problem Relation Age of Onset  . Hypothyroidism Mother   . Diabetes Father   . Hypertension Father   . Diabetes Sister   . Hypertension Sister   . Cancer Maternal Grandmother        throat  . Anesthesia problems Neg Hx     Social History Social History   Tobacco Use  . Smoking status: Never Smoker  . Smokeless tobacco: Never Used  Substance Use Topics  . Alcohol use: No  . Drug use: No     Allergies   Patient has no known allergies.   Review of Systems Review of Systems  All other systems reviewed and are negative.    Physical Exam Updated Vital Signs BP 121/84 (BP Location:  Left Arm)   Pulse 100   Temp 98.5 F (36.9 C) (Oral)   Resp 20   LMP 07/12/2018 (Exact Date)   SpO2 100%   Physical Exam Vitals signs and nursing note reviewed.  Constitutional:      General: She is not in acute distress.    Appearance: Normal appearance. She is not ill-appearing.  HENT:     Head: Normocephalic.  Neck:     Musculoskeletal: Normal range of motion.  Pulmonary:     Effort: Pulmonary effort is normal.  Musculoskeletal:     Comments: The right shoulder appears grossly normal.  There is tenderness to palpation over the lateral deltoid and posterior deltoid.  She has pain with any range of motion.  Ulnar and radial pulses are easily palpable.  She is able to flex, extend, and oppose all fingers and sensation is intact throughout the entire hand.  Neurological:     Mental Status: She is alert.      ED Treatments / Results  Labs (all labs ordered are  listed, but only abnormal results are displayed) Labs Reviewed - No data to display  EKG None  Radiology Dg Shoulder Right  Result Date: 07/17/2018 CLINICAL DATA:  Fall with right shoulder pain. EXAM: RIGHT SHOULDER - 2+ VIEW COMPARISON:  None. FINDINGS: There is no evidence of fracture or dislocation. Glenoid anchor. There is no evidence of arthropathy or other focal bone abnormality. Soft tissues are unremarkable. IMPRESSION: Postoperative shoulder without acute finding. Electronically Signed   By: Monte Fantasia M.D.   On: 07/17/2018 06:48    Procedures Procedures (including critical care time)  Medications Ordered in ED Medications - No data to display   Initial Impression / Assessment and Plan / ED Course  I have reviewed the triage vital signs and the nursing notes.  Pertinent labs & imaging results that were available during my care of the patient were reviewed by me and considered in my medical decision making (see chart for details).  Patient with a shoulder sprain.  X-rays are negative.  She will be discharged with a sling, rest, ice, NSAIDs, pain medication, and follow-up as needed if not improving in the next week.  Final Clinical Impressions(s) / ED Diagnoses   Final diagnoses:  None    ED Discharge Orders    None       Veryl Speak, MD 07/17/18 [phone]

## 2018-07-17 NOTE — ED Triage Notes (Signed)
Pt c/o right shoulder pain after falling on treadmill this morning.

## 2018-07-17 NOTE — Discharge Instructions (Addendum)
Wear arm sling as applied for the next several days, then slowly attempt to reintroduce activity.  Ice for 20 minutes every 2 hours while awake for the next 2 days.  Ibuprofen 600 mg 3 times daily for the next 5 days.  Hydrocodone as prescribed as needed for pain not relieved with ibuprofen.  Follow-up with your primary doctor if symptoms or not improving in the next week to discuss physical therapy or further imaging.

## 2018-07-23 DIAGNOSIS — M12811 Other specific arthropathies, not elsewhere classified, right shoulder: Secondary | ICD-10-CM | POA: Insufficient documentation

## 2019-06-22 ENCOUNTER — Other Ambulatory Visit: Payer: Self-pay

## 2019-06-22 ENCOUNTER — Emergency Department (HOSPITAL_BASED_OUTPATIENT_CLINIC_OR_DEPARTMENT_OTHER)
Admission: EM | Admit: 2019-06-22 | Discharge: 2019-06-23 | Disposition: A | Discharge status: Home / Self Care | Payer: BC Managed Care – PPO | Attending: Emergency Medicine | Admitting: Emergency Medicine

## 2019-06-22 ENCOUNTER — Emergency Department (HOSPITAL_BASED_OUTPATIENT_CLINIC_OR_DEPARTMENT_OTHER): Payer: BC Managed Care – PPO

## 2019-06-22 ENCOUNTER — Encounter (HOSPITAL_BASED_OUTPATIENT_CLINIC_OR_DEPARTMENT_OTHER): Payer: Self-pay | Admitting: Emergency Medicine

## 2019-06-22 DIAGNOSIS — Y929 Unspecified place or not applicable: Secondary | ICD-10-CM | POA: Diagnosis not present

## 2019-06-22 DIAGNOSIS — S32022A Unstable burst fracture of second lumbar vertebra, initial encounter for closed fracture: Secondary | ICD-10-CM | POA: Diagnosis not present

## 2019-06-22 DIAGNOSIS — Y9301 Activity, walking, marching and hiking: Secondary | ICD-10-CM | POA: Insufficient documentation

## 2019-06-22 DIAGNOSIS — N3091 Cystitis, unspecified with hematuria: Secondary | ICD-10-CM | POA: Diagnosis not present

## 2019-06-22 DIAGNOSIS — W108XXA Fall (on) (from) other stairs and steps, initial encounter: Secondary | ICD-10-CM | POA: Insufficient documentation

## 2019-06-22 DIAGNOSIS — Y999 Unspecified external cause status: Secondary | ICD-10-CM | POA: Insufficient documentation

## 2019-06-22 DIAGNOSIS — S32009A Unspecified fracture of unspecified lumbar vertebra, initial encounter for closed fracture: Secondary | ICD-10-CM

## 2019-06-22 DIAGNOSIS — N3001 Acute cystitis with hematuria: Secondary | ICD-10-CM

## 2019-06-22 DIAGNOSIS — R300 Dysuria: Secondary | ICD-10-CM | POA: Diagnosis present

## 2019-06-22 LAB — PREGNANCY, URINE: Preg Test, Ur: NEGATIVE

## 2019-06-22 LAB — COMPREHENSIVE METABOLIC PANEL
ALT: 16 U/L (ref 0–44)
AST: 16 U/L (ref 15–41)
Albumin: 4 g/dL (ref 3.5–5.0)
Alkaline Phosphatase: 61 U/L (ref 38–126)
Anion gap: 6 (ref 5–15)
BUN: 19 mg/dL (ref 6–20)
CO2: 26 mmol/L (ref 22–32)
Calcium: 9.5 mg/dL (ref 8.9–10.3)
Chloride: 107 mmol/L (ref 98–111)
Creatinine, Ser: 0.81 mg/dL (ref 0.44–1.00)
GFR calc Af Amer: >60 mL/min (ref >60)
GFR calc non Af Amer: >60 mL/min (ref >60)
Glucose, Bld: 97 mg/dL (ref 70–99)
Potassium: 3.9 mmol/L (ref 3.5–5.1)
Sodium: 139 mmol/L (ref 135–145)
Total Bilirubin: 0.4 mg/dL (ref 0.3–1.2)
Total Protein: 7.1 g/dL (ref 6.5–8.1)

## 2019-06-22 LAB — CBC WITH DIFFERENTIAL/PLATELET
Abs Immature Granulocytes: 0.02 10*3/uL (ref 0.00–0.07)
Basophils Absolute: 0.1 10*3/uL (ref 0.0–0.1)
Basophils Relative: 1 %
Eosinophils Absolute: 0.1 10*3/uL (ref 0.0–0.5)
Eosinophils Relative: 1 %
HCT: 37.5 % (ref 36.0–46.0)
Hemoglobin: 12.1 g/dL (ref 12.0–15.0)
Immature Granulocytes: 0 %
Lymphocytes Relative: 23 %
Lymphs Abs: 2.6 10*3/uL (ref 0.7–4.0)
MCH: 31.9 pg (ref 26.0–34.0)
MCHC: 32.3 g/dL (ref 30.0–36.0)
MCV: 98.9 fL (ref 80.0–100.0)
Monocytes Absolute: 0.8 10*3/uL (ref 0.1–1.0)
Monocytes Relative: 7 %
Neutro Abs: 7.5 10*3/uL (ref 1.7–7.7)
Neutrophils Relative %: 68 %
Platelets: 276 10*3/uL (ref 150–400)
RBC: 3.79 MIL/uL — ABNORMAL LOW (ref 3.87–5.11)
RDW: 13.2 % (ref 11.5–15.5)
WBC: 11.1 10*3/uL — ABNORMAL HIGH (ref 4.0–10.5)
nRBC: 0 % (ref 0.0–0.2)

## 2019-06-22 LAB — URINALYSIS, ROUTINE W REFLEX MICROSCOPIC
Bilirubin Urine: NEGATIVE
Glucose, UA: NEGATIVE mg/dL
Ketones, ur: NEGATIVE mg/dL
Nitrite: POSITIVE — AB
Protein, ur: NEGATIVE mg/dL
Specific Gravity, Urine: >1.03 — ABNORMAL HIGH (ref 1.005–1.030)
pH: 6 (ref 5.0–8.0)

## 2019-06-22 LAB — URINALYSIS, MICROSCOPIC (REFLEX): WBC, UA: >50 WBC/hpf (ref 0–5)

## 2019-06-22 MED ORDER — IOHEXOL 300 MG/ML  SOLN
100.0000 mL | Freq: Once | INTRAMUSCULAR | Status: AC | PRN
  Administered 2019-06-22: 100 mL via INTRAVENOUS

## 2019-06-22 MED ORDER — OXYCODONE-ACETAMINOPHEN 5-325 MG PO TABS
1.0000 | ORAL_TABLET | Freq: Once | ORAL | Status: AC
  Administered 2019-06-22: 1 via ORAL
  Filled 2019-06-22: qty 1

## 2019-06-22 NOTE — ED Triage Notes (Signed)
Pt reports fall a couple days ago with constant back pain since. Also reports dysuria, hematuria starting today.

## 2019-06-23 DIAGNOSIS — K449 Diaphragmatic hernia without obstruction or gangrene: Secondary | ICD-10-CM | POA: Insufficient documentation

## 2019-06-23 DIAGNOSIS — D259 Leiomyoma of uterus, unspecified: Secondary | ICD-10-CM | POA: Insufficient documentation

## 2019-06-23 MED ORDER — CEPHALEXIN 500 MG PO CAPS: 500.0000 mg | ORAL_CAPSULE | Freq: Four times a day (QID) | ORAL | 0 refills | Status: DC

## 2019-06-23 MED ORDER — CEPHALEXIN 500 MG PO CAPS: 500.0000 mg | ORAL_CAPSULE | Freq: Four times a day (QID) | ORAL | 0 refills | Status: AC

## 2019-06-23 NOTE — Discharge Instructions (Addendum)
Recommend taking Tylenol, Motrin as needed for control.  Please take antibiotics as directed for your urinary tract infection.  Return to ER if you develop any abdominal pain, fever, vomiting or other new concerning symptom.  Recommend follow-up either with your primary doctor or neurosurgery as discussed

## 2019-06-23 NOTE — ED Provider Notes (Signed)
[facility] EMERGENCY DEPARTMENT Provider Note   CSN: [id number] Arrival date & time: 06/22/19  2101     History Chief Complaint  Patient presents with  . Fall  . Dysuria    [name] is a 48 y.o. female.  Presents to ER with chief complaint dysuria, back pain.  Regarding back pain, reports that she fell a few days ago, slipped on last step going down the steps and landed on her back.  Noted acute onset of back pain, states it has been relatively constant, worse with certain positions, more on the left lower side, nonradiating, dull, aching.  States no associated numbness, weakness in her extremities.  No other injuries, no head trauma.  Today she noted some light blood in her urine as well as pain with urination.  No fevers, no vomiting.  HPI     Past Medical History:  Diagnosis Date  . AMA (advanced maternal age) multigravida 54+   . Dysrhythmia    palpitations  . Fibroid, uterine   . GERD (gastroesophageal reflux disease)   . Headache(784.0)   . Heart murmur   . History of chicken pox   . Hyperemesis gravidarum   . Obesity   . Ovarian cyst   . Pregnancy with history of pre-term labor   . Sacroiliitis ([place]) 12/23/2010  . Sinusitis 06/08/2011  . Uterine fibroid     Patient Active Problem List   Diagnosis Date Noted  . Tooth abscess 12/27/2012  . Postpartum care following vaginal delivery (4/3) 10/12/2011  . Sinusitis 06/08/2011  . Acute gastroenteritis 06/06/2011  . Threatened abortion 04/09/2011  . Hyperemesis complicating pregnancy, antepartum 03/14/2011    Class: Acute  . Sacroiliitis ([place]) 12/23/2010  . CERVICITIS, ACUTE 06/07/2010  . ABDOMINAL TENDERNESS UNSPECIFIED SITE 06/01/2010  . BACK PAIN 01/25/2010  . KNEE PAIN, RIGHT 07/16/2008  . GASTROESOPHAGEAL REFLUX DISEASE, HX OF 07/16/2008    Past Surgical History:  Procedure Laterality Date  . LAPAROSCOPY     adhesions  . rt shoulder surgery    . TONSILLECTOMY    . TONSILLECTOMY  AND ADENOIDECTOMY       OB History    Gravida  4   Para  4   Term  2   Preterm  2   AB      Living  4     SAB      TAB      Ectopic      Multiple  1   Live Births  4           Family History  Problem Relation Age of Onset  . Hypothyroidism Mother   . Diabetes Father   . Hypertension Father   . Diabetes Sister   . Hypertension Sister   . Cancer Maternal Grandmother        throat  . Anesthesia problems Neg Hx     Social History   Tobacco Use  . Smoking status: Never Smoker  . Smokeless tobacco: Never Used  Substance Use Topics  . Alcohol use: No  . Drug use: No    Home Medications Prior to Admission medications   Medication Sig Start Date End Date Taking? Authorizing Provider  cephALEXin (KEFLEX) 500 MG capsule Take 1 capsule (500 mg total) by mouth 4 (four) times daily for 7 days. 06/23/19 06/30/19  Lucrezia Starch, MD  HYDROcodone-acetaminophen (NORCO) 5-325 MG tablet Take 1-2 tablets by mouth every 6 (six) hours as needed. 07/17/18   Veryl Speak, MD  Allergies    Patient has no known allergies.  Review of Systems   Review of Systems  Constitutional: Negative for chills and fever.  HENT: Negative for ear pain and sore throat.   Eyes: Negative for pain and visual disturbance.  Respiratory: Negative for cough and shortness of breath.   Cardiovascular: Negative for chest pain and palpitations.  Gastrointestinal: Negative for abdominal pain and vomiting.  Genitourinary: Positive for dysuria and hematuria.  Musculoskeletal: Positive for back pain. Negative for arthralgias.  Skin: Negative for color change and rash.  Neurological: Negative for seizures and syncope.  All other systems reviewed and are negative.   Physical Exam Updated Vital Signs BP 119/80 (BP Location: Right Arm)   Pulse 66   Temp 98.6 F (37 C) (Oral)   Resp 18   Ht 5\' 4"  (1.626 m)   Wt 76.2 kg   SpO2 100%   BMI 28.84 kg/m   Physical Exam Vitals and nursing  note reviewed.  Constitutional:      General: She is not in acute distress.    Appearance: She is well-developed.  HENT:     Head: Normocephalic and atraumatic.  Eyes:     Conjunctiva/sclera: Conjunctivae normal.  Cardiovascular:     Rate and Rhythm: Normal rate and regular rhythm.     Heart sounds: No murmur.  Pulmonary:     Effort: Pulmonary effort is normal. No respiratory distress.     Breath sounds: Normal breath sounds.  Abdominal:     Palpations: Abdomen is soft.     Tenderness: There is no abdominal tenderness.  Musculoskeletal:     Cervical back: Neck supple.     Comments: TTP over mid/upper L spine No tenderness over T-spine No tenderness palpation throughout all 4 extremities  Skin:    General: Skin is warm and dry.     Capillary Refill: Capillary refill takes less than 2 seconds.  Neurological:     General: No focal deficit present.     Mental Status: She is alert and oriented to person, place, and time.     Comments: 5 out of 5 strength in bilateral lower extremities, sensation to light touch intact in lower extremities  Psychiatric:        Mood and Affect: Mood normal.        Behavior: Behavior normal.     ED Results / Procedures / Treatments   Labs (all labs ordered are listed, but only abnormal results are displayed) Labs Reviewed  URINALYSIS, ROUTINE W REFLEX MICROSCOPIC - Abnormal; Notable for the following components:      Result Value   APPearance CLOUDY (*)    Specific Gravity, Urine >1.030 (*)    Hgb urine dipstick LARGE (*)    Nitrite POSITIVE (*)    Leukocytes,Ua MODERATE (*)    All other components within normal limits  URINALYSIS, MICROSCOPIC (REFLEX) - Abnormal; Notable for the following components:   Bacteria, UA MANY (*)    All other components within normal limits  CBC WITH DIFFERENTIAL/PLATELET - Abnormal; Notable for the following components:   WBC 11.1 (*)    RBC 3.79 (*)    All other components within normal limits  URINE CULTURE   PREGNANCY, URINE  COMPREHENSIVE METABOLIC PANEL    EKG None  Radiology CT ABDOMEN PELVIS W CONTRAST  Result Date: 06/22/2019 CLINICAL DATA:  Flank pain. Kidney stones suspected. Trauma. Hematuria. Pain is on the left side. Pain status post fall couple days ago with consistent back pain  EXAM: CT ABDOMEN AND PELVIS WITH CONTRAST TECHNIQUE: Multidetector CT imaging of the abdomen and pelvis was performed using the standard protocol following bolus administration of intravenous contrast. CONTRAST:  148mL OMNIPAQUE IOHEXOL 300 MG/ML  SOLN COMPARISON:  CT dated June 01, 2010 FINDINGS: Lower chest: The lung bases are clear. The heart size is normal. Hepatobiliary: Cysts are noted throughout the left and right hepatic lobe. Normal gallbladder.There is no biliary ductal dilation. Pancreas: Normal contours without ductal dilatation. No peripancreatic fluid collection. Spleen: No splenic laceration or hematoma. Adrenals/Urinary Tract: --Adrenal glands: No adrenal hemorrhage. --Right kidney/ureter: No hydronephrosis or perinephric hematoma. --Left kidney/ureter: No hydronephrosis or perinephric hematoma. --Urinary bladder: Unremarkable. Stomach/Bowel: --Stomach/Duodenum: There is a small hiatal hernia. --Small bowel: No dilatation or inflammation. --Colon: There is an above average amount of stool throughout the colon. --Appendix: Normal. Vascular/Lymphatic: Normal course and caliber of the major abdominal vessels. --No retroperitoneal lymphadenopathy. --No mesenteric lymphadenopathy. --No pelvic or inguinal lymphadenopathy. Reproductive: Uterus is enlarged with multiple fibroids. Other: There is a small volume of pelvic free fluid which is likely physiologic. No free air. The abdominal wall is normal. Musculoskeletal. There is a mildly displaced fracture involving the left L2 transverse process. IMPRESSION: 1. Mildly displaced fracture of the left L2 transverse process. 2. No hydronephrosis. No radiopaque  kidney stones. No evidence for renal laceration. 3. Small volume of pelvic free fluid is likely physiologic. 4. Enlarged, fibroid uterus. 5. Small hiatal hernia. 6. Large stool burden. Electronically Signed   By: Constance Holster M.D.   On: 06/22/2019 23:46    Procedures Procedures (including critical care time)  Medications Ordered in ED Medications  oxyCODONE-acetaminophen (PERCOCET/ROXICET) 5-325 MG per tablet 1 tablet (1 tablet Oral Given 06/22/19 2248)  iohexol (OMNIPAQUE) 300 MG/ML solution 100 mL (100 mLs Intravenous Contrast Given 06/22/19 2327)    ED Course  I have reviewed the triage vital signs and the nursing notes.  Pertinent labs & imaging results that were available during my care of the patient were reviewed by me and considered in my medical decision making (see chart for details).    MDM Rules/Calculators/A&P     CHA2DS2/VAS Stroke Risk Points      N/A >= 2 Points: High Risk  1 - 1.99 Points: Medium Risk  0 Points: Low Risk    A final score could not be computed because of missing components.: Last  Change: N/A     This score determines the patient's risk of having a stroke if the  patient has atrial fibrillation.      This score is not applicable to this patient. Components are not  calculated.                   48 year old presented to ER with back pain after falling a few days ago as well as new onset of dysuria.  On exam patient was noted to be well-appearing, noted some point tenderness in her right mid L-spine.  UA concerning for UTI.  CT abdomen ordered to further evaluate.  Concerning for a single transverse process fracture with mild displacement and lumbar spine.  Suspect this is the etiology for her back pain related to her fall.  Her pain is relatively well controlled at this time and for this issue she is appropriate for outpatient management, provided neurosurgery info for follow up.  No evidence for trauma to surrounding tissues, neuro intact.  Labs  otherwise within normal limits, suspect cystitis.  Started on course of cephalexin.  For  this, recommend recheck with PCP.    After the discussed management above, the patient was determined to be safe for discharge.  The patient was in agreement with this plan and all questions regarding their care were answered.  ED return precautions were discussed and the patient will return to the ED with any significant worsening of condition.    Final Clinical Impression(s) / ED Diagnoses Final diagnoses:  Acute cystitis with hematuria  Closed fracture of transverse process of lumbar vertebra, initial encounter [facility])    Rx / DC Orders ED Discharge Orders         Ordered    cephALEXin (KEFLEX) 500 MG capsule  4 times daily,   Status:  Discontinued     06/23/19 0007    cephALEXin (KEFLEX) 500 MG capsule  4 times daily     06/23/19 0007           Lucrezia Starch, MD 06/23/19 1610

## 2019-06-25 LAB — URINE CULTURE: Culture: >=100000 — AB

## 2019-06-26 ENCOUNTER — Telehealth: Payer: Self-pay

## 2019-06-26 NOTE — Telephone Encounter (Signed)
Post ED Visit - Positive Culture Follow-up: Successful Patient Follow-Up  Culture assessed and recommendations reviewed by:  []  Elenor Quinones, Pharm.D. []  Heide Guile, Pharm.D., BCPS AQ-ID []  Parks Neptune, Pharm.D., BCPS []  Alycia Rossetti, Pharm.D., BCPS []  [place], [place].D., BCPS, AAHIVP []  Legrand Como, Pharm.D., BCPS, AAHIVP []  Salome Arnt, PharmD, BCPS []  Johnnette Gourd, PharmD, BCPS []  Hughes Better, PharmD, BCPS []  Leeroy Cha, PharmD  Gorden Harms Pharm D Positive urine culture  []  Patient discharged without antimicrobial prescription and treatment is now indicated [x]  Organism is resistant to prescribed ED discharge antimicrobial []  Patient with positive blood cultures  Changes discussed with ED provider: Charmaine Downs PA  New antibiotic prescription Bactrim DS 1 BID x 5 days Called to [facility] [phone]  Contacted patient, date 06/26/2019, time 1049   [name], Carolynn Comment 06/26/2019, 10:48 AM

## 2019-06-26 NOTE — Progress Notes (Signed)
ED Antimicrobial Stewardship Positive Culture Follow Up   [name] is an 48 y.o. female who presented to [facility] on 06/22/2019 with a chief complaint of  Chief Complaint  Patient presents with  . Fall  . Dysuria   Presenting with dysuria and back pain. UA showing mod leukocytes, positive nitrite, many bacteria, WBC>50, and RBC 21-50. Scr 0.8.  Recent Results (from the past 720 hour(s))  Urine culture     Status: Abnormal   Collection Time: 06/22/19  9:21 PM   Specimen: Urine, Clean Catch  Result Value Ref Range Status   Specimen Description   Final    URINE, CLEAN CATCH Performed at [facility], [address] [place], [place] [address]    Special Requests   Final    NONE Performed at [facility], [address] [place], [place] [address]    Culture (A)  Final    >=100,000 COLONIES/mL ESCHERICHIA COLI Confirmed Extended Spectrum Beta-Lactamase Producer (ESBL).  In bloodstream infections from ESBL organisms, carbapenems are preferred over piperacillin/tazobactam. They are shown to have a lower risk of mortality.    Report Status 06/25/2019 FINAL  Final   Organism ID, Bacteria ESCHERICHIA COLI (A)  Final      Susceptibility   Escherichia coli - MIC*    AMPICILLIN >=32 RESISTANT Resistant     CEFAZOLIN >=64 RESISTANT Resistant     CEFTRIAXONE >=64 RESISTANT Resistant     CIPROFLOXACIN <=0.25 SENSITIVE Sensitive     GENTAMICIN <=1 SENSITIVE Sensitive     IMIPENEM <=0.25 SENSITIVE Sensitive     NITROFURANTOIN 64 INTERMEDIATE Intermediate     TRIMETH/SULFA <=20 SENSITIVE Sensitive     AMPICILLIN/SULBACTAM 8 SENSITIVE Sensitive     PIP/TAZO <=4 SENSITIVE Sensitive     * >=100,000 COLONIES/mL ESCHERICHIA COLI    [x]  Treated with cephalexin, organism resistant to prescribed antimicrobial  New antibiotic prescription: Bactrim DS 1 tablet twice daily for 5 days.  ED Provider: [address], PA-C   Julien Girt 06/26/2019, 1:00  PM Clinical Pharmacist Monday - Friday phone -  [phone] Saturday - Sunday phone - [phone]

## 2019-09-23 DIAGNOSIS — F419 Anxiety disorder, unspecified: Secondary | ICD-10-CM | POA: Insufficient documentation

## 2020-01-30 DIAGNOSIS — M542 Cervicalgia: Secondary | ICD-10-CM | POA: Insufficient documentation

## 2020-06-17 IMAGING — CT CT ABD-PELV W/ CM
2 of 5 series · 15 of 46 positions shown, 17 images · IV contrast (Omnipaque)
Comparison: CT dated June 01, 2010

CLINICAL DATA: Flank pain. Kidney stones suspected. Trauma.
Hematuria. Pain is on the left side. Pain status post fall couple
days ago with consistent back pain

EXAM:
CT ABDOMEN AND PELVIS WITH CONTRAST
TECHNIQUE: Multidetector CT imaging of the abdomen and pelvis was performed
using the standard protocol following bolus administration of
intravenous contrast.
CONTRAST:  100mL OMNIPAQUE IOHEXOL 300 MG/ML  SOLN

[Series 2: axial st · axial · 0.63mm/px · z∈[-436,-56]mm · 12 of 86 slices shown, 14 images]
[im 5/86  soft-tissue]
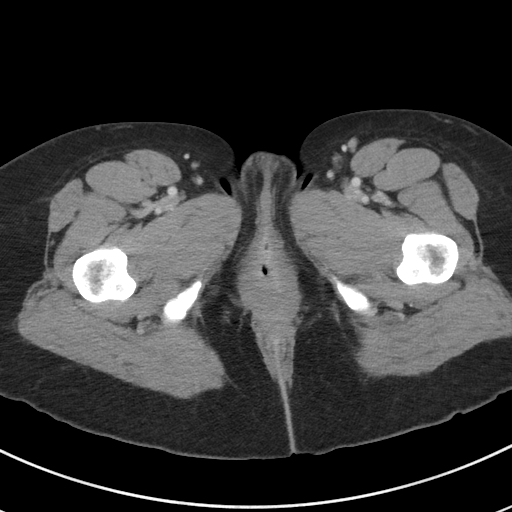
[im 5/86  bone]
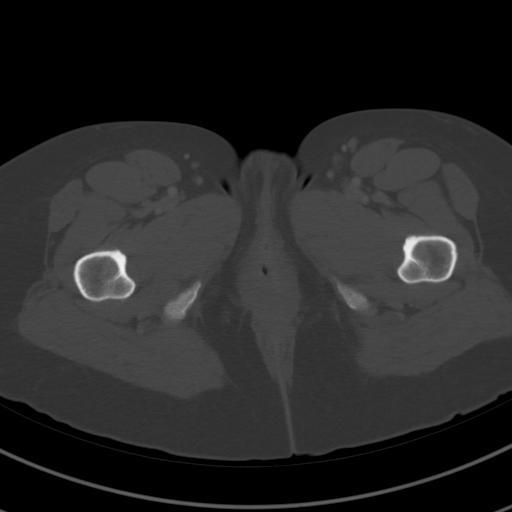
[im 14/86  soft-tissue]
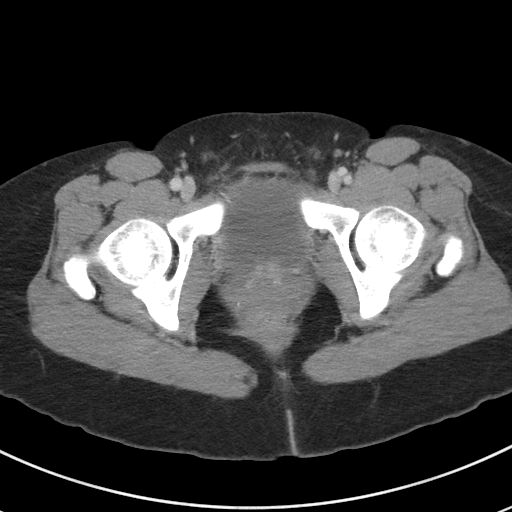
[im 18/86  soft-tissue]
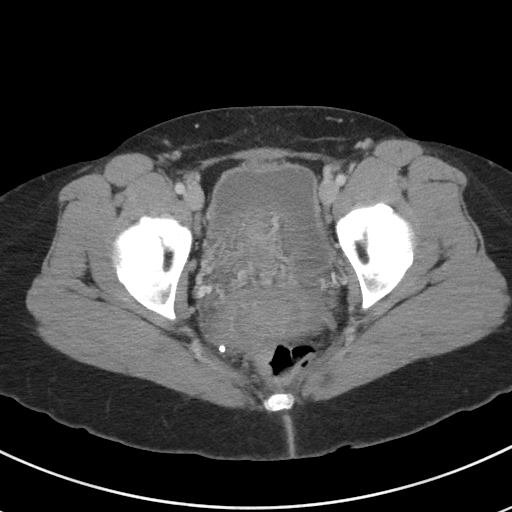
[im 27/86  soft-tissue]
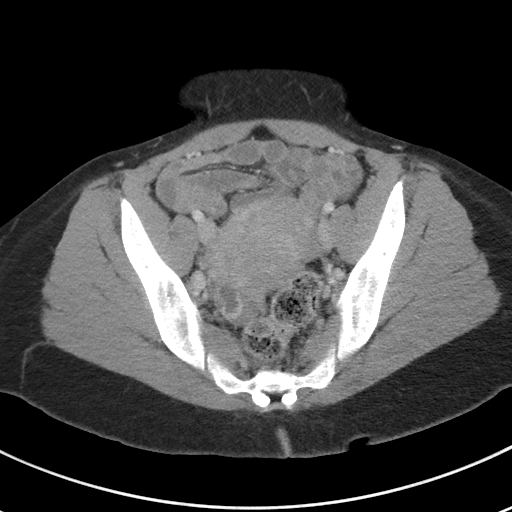
[im 32/86  soft-tissue]
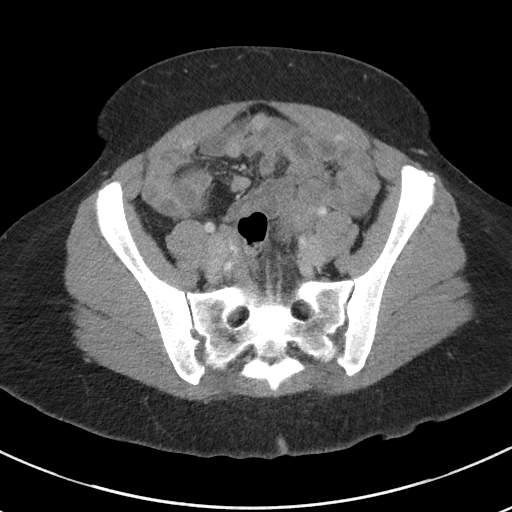
[im 41/86  soft-tissue]
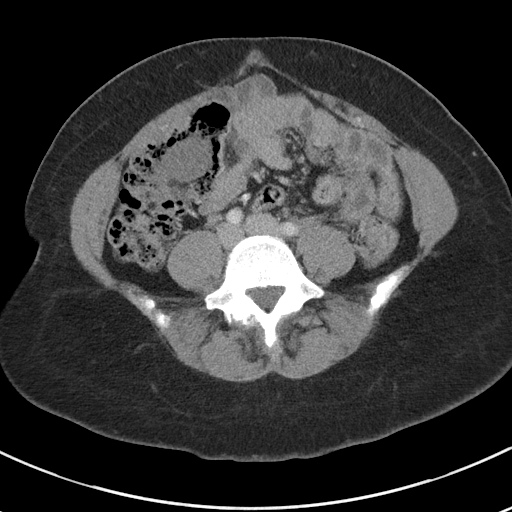
[im 45/86  soft-tissue]
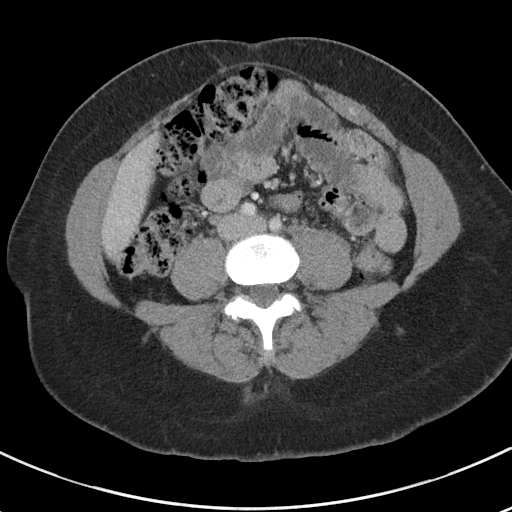
[im 54/86  soft-tissue]
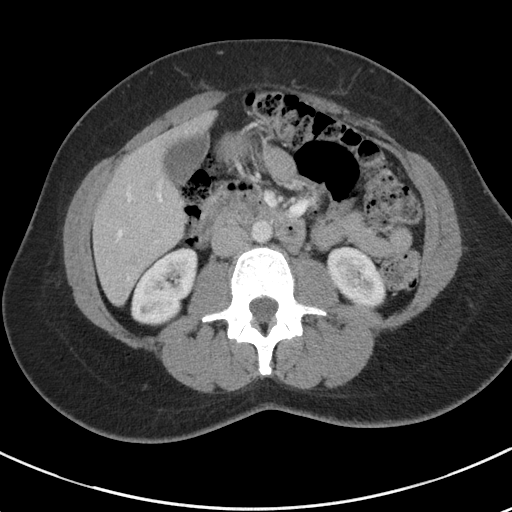
[im 59/86  soft-tissue]
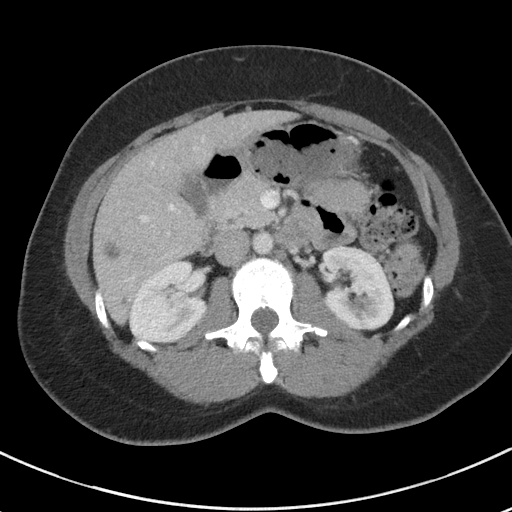
[im 59/86  bone]
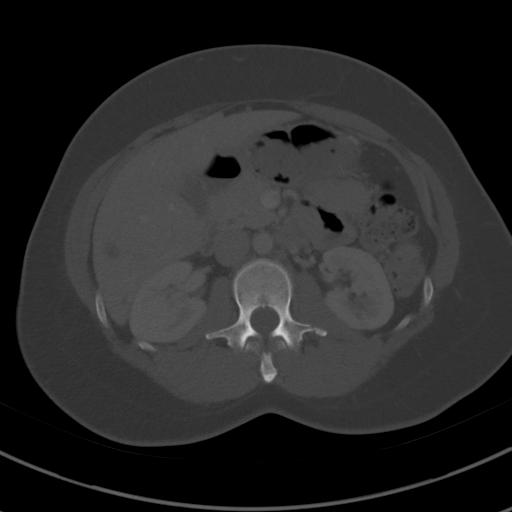
[im 68/86  soft-tissue]
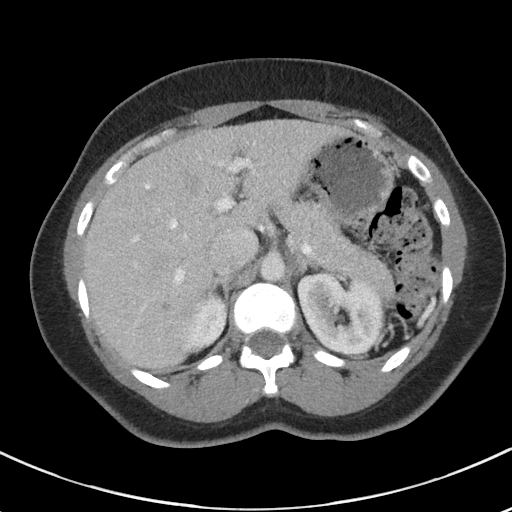
[im 72/86  soft-tissue]
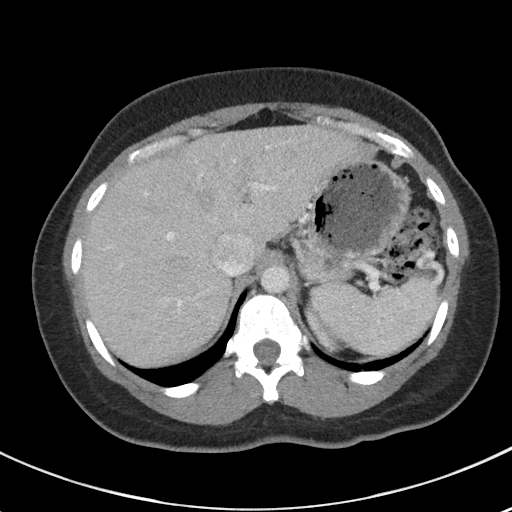
[im 81/86  soft-tissue]
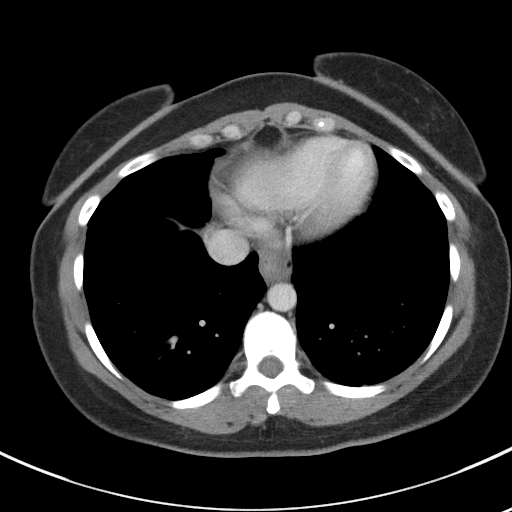

[Series 5: coronal st · coronal · 0.64mm/px · 3 of 82 slices shown]
[im 28/82  soft-tissue]
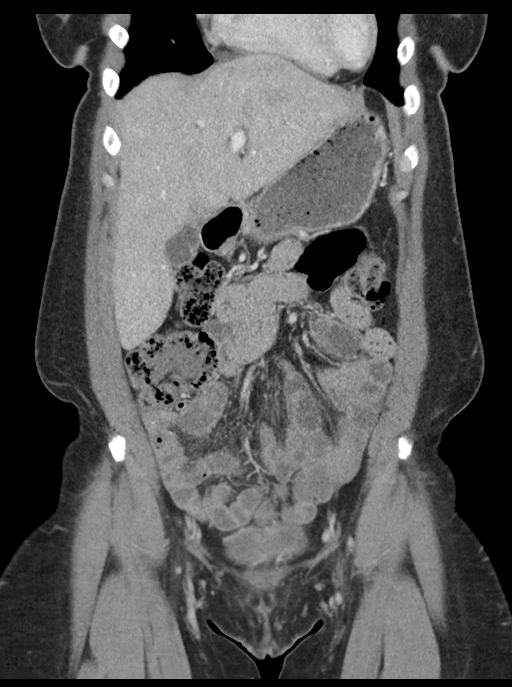
[im 37/82  soft-tissue]
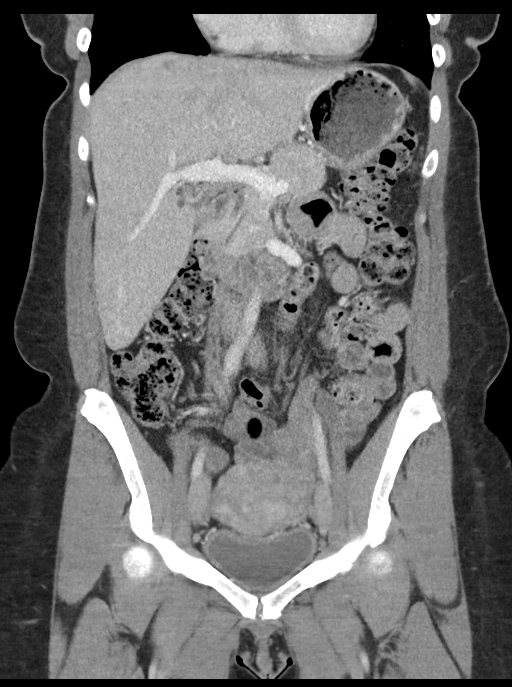
[im 46/82  soft-tissue]
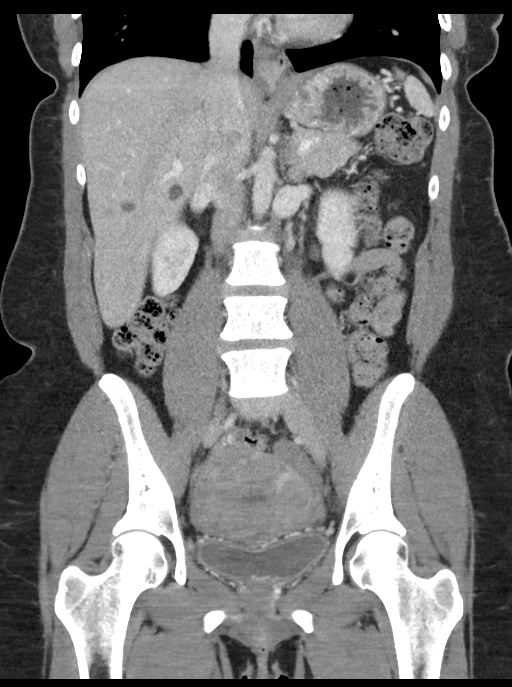

[15 of 46 positions shown; findings below may reference images not displayed]

FINDINGS: Lower chest: The lung bases are clear. The heart size is normal.

Hepatobiliary: Cysts are noted throughout the left and right hepatic
lobe. Normal gallbladder.There is no biliary ductal dilation.

Pancreas: Normal contours without ductal dilatation. No
peripancreatic fluid collection.

Spleen: No splenic laceration or hematoma.

Adrenals/Urinary Tract:

--Adrenal glands: No adrenal hemorrhage.

--Right kidney/ureter: No hydronephrosis or perinephric hematoma.

--Left kidney/ureter: No hydronephrosis or perinephric hematoma.

--Urinary bladder: Unremarkable.

Stomach/Bowel:

--Stomach/Duodenum: There is a small hiatal hernia.

--Small bowel: No dilatation or inflammation.

--Colon: There is an above average amount of stool throughout the
colon.

--Appendix: Normal.

Vascular/Lymphatic: Normal course and caliber of the major abdominal
vessels.

--No retroperitoneal lymphadenopathy.

--No mesenteric lymphadenopathy.

--No pelvic or inguinal lymphadenopathy.

Reproductive: Uterus is enlarged with multiple fibroids.

Other: There is a small volume of pelvic free fluid which is likely
physiologic. No free air. The abdominal wall is normal.

Musculoskeletal. There is a mildly displaced fracture involving the
left L2 transverse process.
IMPRESSION: 1. Mildly displaced fracture of the left L2 transverse process.
2. No hydronephrosis. No radiopaque kidney stones. No evidence for
renal laceration.
3. Small volume of pelvic free fluid is likely physiologic.
4. Enlarged, fibroid uterus.
5. Small hiatal hernia.
6. Large stool burden.

## 2020-08-18 ENCOUNTER — Ambulatory Visit: Payer: BC Managed Care – PPO | Attending: Family

## 2020-08-18 DIAGNOSIS — Z23 Encounter for immunization: Secondary | ICD-10-CM

## 2020-12-09 NOTE — Progress Notes (Signed)
   Covid-19 Vaccination Clinic  Name:  [name]    MRN: [id number] DOB: May 25, 1971  12/09/2020  [name] was observed post Covid-19 immunization for 15 minutes without incident. She was provided with Vaccine Information Sheet and instruction to access the V-Safe system.   [name] was instructed to call 911 with any severe reactions post vaccine: Marland Kitchen Difficulty breathing  . Swelling of face and throat  . A fast heartbeat  . A bad rash all over body  . Dizziness and weakness   Immunizations Administered    Name Date Dose VIS Date Route   Pfizer COVID-19 Vaccine 08/18/2020 10:30 AM 0.3 mL 04/28/2020 Intramuscular   Manufacturer: [place]   Lot: [id number]   NDC: [address]-3

## 2021-04-25 ENCOUNTER — Other Ambulatory Visit: Payer: Self-pay

## 2021-04-25 ENCOUNTER — Ambulatory Visit: Payer: BC Managed Care – PPO | Admitting: Podiatry

## 2021-04-25 ENCOUNTER — Ambulatory Visit (INDEPENDENT_AMBULATORY_CARE_PROVIDER_SITE_OTHER): Payer: BC Managed Care – PPO

## 2021-04-25 VITALS — BP 122/69 | HR 70 | Temp 98.7°F

## 2021-04-25 DIAGNOSIS — M7731 Calcaneal spur, right foot: Secondary | ICD-10-CM

## 2021-04-25 DIAGNOSIS — M21611 Bunion of right foot: Secondary | ICD-10-CM | POA: Diagnosis not present

## 2021-04-25 DIAGNOSIS — M21619 Bunion of unspecified foot: Secondary | ICD-10-CM

## 2021-04-25 DIAGNOSIS — M79671 Pain in right foot: Secondary | ICD-10-CM

## 2021-04-25 DIAGNOSIS — M79672 Pain in left foot: Secondary | ICD-10-CM

## 2021-04-25 DIAGNOSIS — M722 Plantar fascial fibromatosis: Secondary | ICD-10-CM | POA: Diagnosis not present

## 2021-04-25 NOTE — Patient Instructions (Signed)
The surgery we talked about is called a "mini bunion". You can read more about it at [email or web address]  Plantar Fasciitis (Heel Spur Syndrome) with Rehab The plantar fascia is a fibrous, ligament-like, soft-tissue structure that spans the bottom of the foot. Plantar fasciitis is a condition that causes pain in the foot due to inflammation of the tissue. SYMPTOMS  Pain and tenderness on the underneath side of the foot. Pain that worsens with standing or walking. CAUSES  Plantar fasciitis is caused by irritation and injury to the plantar fascia on the underneath side of the foot. Common mechanisms of injury include: Direct trauma to bottom of the foot. Damage to a small nerve that runs under the foot where the main fascia attaches to the heel bone. Stress placed on the plantar fascia due to bone spurs. RISK INCREASES WITH:  Activities that place stress on the plantar fascia (running, jumping, pivoting, or cutting). Poor strength and flexibility. Improperly fitted shoes. Tight calf muscles. Flat feet. Failure to warm-up properly before activity. Obesity. PREVENTION Warm up and stretch properly before activity. Allow for adequate recovery between workouts. Maintain physical fitness: Strength, flexibility, and endurance. Cardiovascular fitness. Maintain a health body weight. Avoid stress on the plantar fascia. Wear properly fitted shoes, including arch supports for individuals who have flat feet.  PROGNOSIS  If treated properly, then the symptoms of plantar fasciitis usually resolve without surgery. However, occasionally surgery is necessary.  RELATED COMPLICATIONS  Recurrent symptoms that may result in a chronic condition. Problems of the lower back that are caused by compensating for the injury, such as limping. Pain or weakness of the foot during push-off following surgery. Chronic inflammation, scarring, and partial or complete fascia tear, occurring more often from  repeated injections.  TREATMENT  Treatment initially involves the use of ice and medication to help reduce pain and inflammation. The use of strengthening and stretching exercises may help reduce pain with activity, especially stretches of the Achilles tendon. These exercises may be performed at home or with a therapist. Your caregiver may recommend that you use heel cups of arch supports to help reduce stress on the plantar fascia. Occasionally, corticosteroid injections are given to reduce inflammation. If symptoms persist for greater than 6 months despite non-surgical (conservative), then surgery may be recommended.   MEDICATION  If pain medication is necessary, then nonsteroidal anti-inflammatory medications, such as aspirin and ibuprofen, or other minor pain relievers, such as acetaminophen, are often recommended. Do not take pain medication within 7 days before surgery. Prescription pain relievers may be given if deemed necessary by your caregiver. Use only as directed and only as much as you need. Corticosteroid injections may be given by your caregiver. These injections should be reserved for the most serious cases, because they may only be given a certain number of times.  HEAT AND COLD Cold treatment (icing) relieves pain and reduces inflammation. Cold treatment should be applied for 10 to 15 minutes every 2 to 3 hours for inflammation and pain and immediately after any activity that aggravates your symptoms. Use ice packs or massage the area with a piece of ice (ice massage). Heat treatment may be used prior to performing the stretching and strengthening activities prescribed by your caregiver, physical therapist, or athletic trainer. Use a heat pack or soak the injury in warm water.  SEEK IMMEDIATE MEDICAL CARE IF: Treatment seems to offer no benefit, or the condition worsens. Any medications produce adverse side effects.  EXERCISES- RANGE OF MOTION (ROM) AND STRETCHING EXERCISES -  CARE IF:  Treatment seems to offer no benefit, or the condition worsens.  Any medications produce adverse side effects.   EXERCISES- RANGE OF MOTION (ROM) AND STRETCHING EXERCISES - Plantar Fasciitis (Heel Spur Syndrome) These exercises may help you when beginning to rehabilitate your injury. Your symptoms may resolve with or without further involvement from your physician, physical therapist or athletic trainer. While completing these exercises, remember:   Restoring tissue flexibility helps normal motion to return to the joints. This allows healthier, less painful movement and activity.  An effective stretch should be held for at least 30 seconds.  A stretch should never be painful. You should only feel a gentle lengthening or release in the stretched tissue.  RANGE OF MOTION - Toe Extension, Flexion  Sit with your right / left leg crossed over your opposite knee.  Grasp your toes and gently pull them back toward the top of your foot. You should feel a stretch on the bottom of your toes and/or foot.  Hold this stretch for 10 seconds.  Now, gently pull your toes toward the bottom of your foot. You should feel a stretch on the top of your toes and or foot.  Hold this stretch for 10 seconds. Repeat  times. Complete this stretch 3 times per day.   RANGE OF MOTION - Ankle Dorsiflexion, Active Assisted  Remove shoes and sit on a chair that is preferably not on a carpeted surface.  Place right / left foot under knee. Extend your opposite leg for support.  Keeping your heel down, slide your right / left foot back toward the chair until you feel a stretch at your ankle or calf. If you do not feel a stretch, slide your bottom forward to the edge of the chair, while still keeping your heel down.  Hold this stretch for 10 seconds. Repeat 3 times. Complete this stretch 2 times per day.   STRETCH  Gastroc, Standing  Place hands on wall.  Extend right / left leg, keeping the front knee somewhat bent.  Slightly point your toes inward on your back foot.  Keeping your right / left heel on the floor and your  knee straight, shift your weight toward the wall, not allowing your back to arch.  You should feel a gentle stretch in the right / left calf. Hold this position for 10 seconds. Repeat 3 times. Complete this stretch 2 times per day.  STRETCH  Soleus, Standing  Place hands on wall.  Extend right / left leg, keeping the other knee somewhat bent.  Slightly point your toes inward on your back foot.  Keep your right / left heel on the floor, bend your back knee, and slightly shift your weight over the back leg so that you feel a gentle stretch deep in your back calf.  Hold this position for 10 seconds. Repeat 3 times. Complete this stretch 2 times per day.  STRETCH  Gastrocsoleus, Standing  Note: This exercise can place a lot of stress on your foot and ankle. Please complete this exercise only if specifically instructed by your caregiver.   Place the ball of your right / left foot on a step, keeping your other foot firmly on the same step.  Hold on to the wall or a rail for balance.  Slowly lift your other foot, allowing your body weight to press your heel down over the edge of the step.  You should feel a stretch in your right / left calf.  Hold this   position for 10 seconds.  Repeat this exercise with a slight bend in your right / left knee. Repeat 3 times. Complete this stretch 2 times per day.   STRENGTHENING EXERCISES - Plantar Fasciitis (Heel Spur Syndrome)  These exercises may help you when beginning to rehabilitate your injury. They may resolve your symptoms with or without further involvement from your physician, physical therapist or athletic trainer. While completing these exercises, remember:   Muscles can gain both the endurance and the strength needed for everyday activities through controlled exercises.  Complete these exercises as instructed by your physician, physical therapist or athletic trainer. Progress the resistance and repetitions only as guided.  STRENGTH -  Towel Curls  Sit in a chair positioned on a non-carpeted surface.  Place your foot on a towel, keeping your heel on the floor.  Pull the towel toward your heel by only curling your toes. Keep your heel on the floor. Repeat 3 times. Complete this exercise 2 times per day.  STRENGTH - Ankle Inversion  Secure one end of a rubber exercise band/tubing to a fixed object (table, pole). Loop the other end around your foot just before your toes.  Place your fists between your knees. This will focus your strengthening at your ankle.  Slowly, pull your big toe up and in, making sure the band/tubing is positioned to resist the entire motion.  Hold this position for 10 seconds.  Have your muscles resist the band/tubing as it slowly pulls your foot back to the starting position. Repeat 3 times. Complete this exercises 2 times per day.  Document Released: 06/26/2005 Document Revised: 09/18/2011 Document Reviewed: 10/08/2008 [employer] Patient Information 2014 [employer]. 

## 2021-05-03 NOTE — Progress Notes (Signed)
Subjective:   Patient ID: [name], female   DOB: 50 y.o.   MRN: [id number]   HPI 82-year female presents the office with concerns of bilateral foot pain.  She said this onset was about 2 years ago and she describes discomfort top of the foot.  She points on the area of her bunion.  She has pain in the morning or at night, to be on her feet.  She is try offloading pads and shoe modifications.  No injury.  Numbness or tingling.  Right side worse than left.  She previous had some heel pain on the right side as well.  No current discomfort.  No recent injury.   Review of Systems  All other systems reviewed and are negative.  Past Medical History:  Diagnosis Date   AMA (advanced maternal age) multigravida 35+    Dysrhythmia    palpitations   Fibroid, uterine    GERD (gastroesophageal reflux disease)    Headache(784.0)    Heart murmur    History of chicken pox    Hyperemesis gravidarum    Obesity    Ovarian cyst    Pregnancy with history of pre-term labor    Sacroiliitis ([place]) 12/23/2010   Sinusitis 06/08/2011   Uterine fibroid     Past Surgical History:  Procedure Laterality Date   LAPAROSCOPY     adhesions   rt shoulder surgery     TONSILLECTOMY     TONSILLECTOMY AND ADENOIDECTOMY       Current Outpatient Medications:    HYDROcodone-acetaminophen (NORCO) 5-325 MG tablet, Take 1-2 tablets by mouth every 6 (six) hours as needed., Disp: 15 tablet, Rfl: 0  No Known Allergies       Objective:  Physical Exam  General: AAO x3, NAD  Dermatological: Skin is warm, dry and supple bilateral.  There are no open sores, no preulcerative lesions, no rash or signs of infection present.  Vascular: Dorsalis Pedis artery and Posterior Tibial artery pedal pulses are 2/4 bilateral with immedate capillary fill time. There is no pain with calf compression, swelling, warmth, erythema.   Neruologic: Grossly intact via light touch bilateral.  Musculoskeletal: Moderate bunions  present bilaterally with the right side worse than left.  There is tenderness present on the medial first metatarsal head on the area the bunion.  No pain or crepitation with first major major motion.  No have ability for history.  There is no significant tenderness on the course or insertion of plantar fascial today.  Muscular strength 5/5 in all groups tested bilateral.  Gait: Unassisted, Nonantalgic.       Assessment:   50 year old female with bilateral bunions, history of right foot plantar fasciitis     Plan:  -Treatment options discussed including all alternatives, risks, and complications -Etiology of symptoms were discussed -X-rays were obtained and reviewed with the patient.  Moderate bunions are present there is no evidence of acute fracture.  He has been present on the right side. -In regards to the bunion discussed both conservative as well as surgical treatment options. Treatment options including conservative and options were discussed surgical intervention including the "mini bunion".  She will let us know if she wants to proceed. -For the heel pain we discussed shoe modifications with arch support as well as stretching.  Trula Slade DPM

## 2021-05-04 ENCOUNTER — Other Ambulatory Visit: Payer: Self-pay | Admitting: Podiatry

## 2021-05-04 DIAGNOSIS — M21619 Bunion of unspecified foot: Secondary | ICD-10-CM

## 2021-05-14 ENCOUNTER — Encounter: Payer: Self-pay | Admitting: Podiatry

## 2021-05-26 ENCOUNTER — Other Ambulatory Visit: Payer: Self-pay

## 2021-05-26 ENCOUNTER — Ambulatory Visit: Payer: BC Managed Care – PPO | Admitting: Podiatry

## 2021-05-26 DIAGNOSIS — M722 Plantar fascial fibromatosis: Secondary | ICD-10-CM

## 2021-05-26 DIAGNOSIS — M21619 Bunion of unspecified foot: Secondary | ICD-10-CM

## 2021-05-26 NOTE — Patient Instructions (Signed)

## 2021-05-29 DIAGNOSIS — M722 Plantar fascial fibromatosis: Secondary | ICD-10-CM | POA: Insufficient documentation

## 2021-05-29 DIAGNOSIS — M21619 Bunion of unspecified foot: Secondary | ICD-10-CM | POA: Insufficient documentation

## 2021-05-29 NOTE — Progress Notes (Signed)
Subjective: 50 year old female presents the office today for follow-up evaluation of right foot bunion pain she wants to further discuss surgical options.  She has been reading about the procedure and she wants to go and proceed with surgery given ongoing discomfort.  She has tried conservative treatments eventually modifications, offloading padding without significant resolution.  Plan fasciitis seems to be doing better.  No recent injury or changes.  Objective: AAO x3, NAD DP/PT pulses palpable bilaterally, CRT less than 3 seconds Moderate bunions are present bilaterally with tenderness on the right foot worse than left.  There is no crepitation or restriction with MPJ range of motion.  There is no other areas of tenderness.  No significant discomfort in the course of insertion of plantar fascia on the right foot today. MMT 5/5 No pain with calf compression, swelling, warmth, erythema  Assessment: Symptomatic bunions right side worse than left, plan fasciitis  Plan: -All treatment options discussed with the patient including all alternatives, risks, complications.  -Regards to the bunion we discussed with conservative as well as surgical options.  This point she was proceed with surgical intervention.  I discussed with her first metatarsal osteotomy with plate, screw fixation ([facility] Mini-Bunion)  After discussion she wants to go and proceed with this. -The incision placement as well as the postoperative course was discussed with the patient. I discussed risks of the surgery which include, but not limited to, infection, bleeding, pain, swelling, need for further surgery, delayed or nonhealing, painful or ugly scar, numbness or sensation changes, over/under correction, recurrence, transfer lesions, further deformity, hardware failure, DVT/PE, loss of toe/foot. Patient understands these risks and wishes to proceed with surgery. The surgical consent was reviewed with the patient all 3 pages were  signed. No promises or guarantees were given to the outcome of the procedure. All questions were answered to the best of my ability. Before the surgery the patient was encouraged to call the office if there is any further questions. The surgery will be performed at the [facility] on an outpatient basis. -For Planter fasciitis continue stretching, icing as well as shoe modifications and arch support.  Trula Slade DPM

## 2021-05-31 ENCOUNTER — Telehealth: Payer: Self-pay | Admitting: Urology

## 2021-05-31 NOTE — Telephone Encounter (Signed)
DOS - 06/22/21  [place] BUNIONECTOMY RIGHT --- [id number]   [employer] EFFECTIVE DATE - 07/10/20   PLAN DEDUCTIBLE - $1,250.00 W/ $1,250.00 REMAINING OUT OF POCKET - $4,890.00 W/ $2,257.50 REMAINING COINSURANCE - 20% COPAY - $0.00   NO PRIOR AUTH REQUIRED

## 2021-06-20 ENCOUNTER — Telehealth: Payer: Self-pay | Admitting: Podiatry

## 2021-06-20 NOTE — Telephone Encounter (Signed)
Called patient to let her know that someone from the Surgery center will call her tomorrow to let her know her sx time and pre-op arrival time. Told patient to call us by 2 pm tomorrow if she has not heard from anyone at the surgery center and we would have them contact her. Told pt to call with any other questions and/or concerns.

## 2021-06-20 NOTE — Telephone Encounter (Signed)
I'm scheduled for my surgery this Wednesday 12/14 and I was wondering if you had an idea the time my surgery is scheduled and what time I need to be there.

## 2021-06-22 ENCOUNTER — Other Ambulatory Visit: Payer: Self-pay | Admitting: Podiatry

## 2021-06-22 ENCOUNTER — Encounter: Payer: Self-pay | Admitting: Podiatry

## 2021-06-22 DIAGNOSIS — M2011 Hallux valgus (acquired), right foot: Secondary | ICD-10-CM | POA: Diagnosis not present

## 2021-06-22 HISTORY — PX: OTHER SURGICAL HISTORY: SHX169

## 2021-06-22 MED ORDER — PROMETHAZINE HCL 25 MG PO TABS: 25.0000 mg | ORAL_TABLET | Freq: Three times a day (TID) | ORAL | 0 refills | Status: AC | PRN

## 2021-06-22 MED ORDER — CEPHALEXIN 500 MG PO CAPS: 500.0000 mg | ORAL_CAPSULE | Freq: Three times a day (TID) | ORAL | 0 refills | Status: DC

## 2021-06-22 MED ORDER — OXYCODONE-ACETAMINOPHEN 5-325 MG PO TABS: 1.0000 | ORAL_TABLET | Freq: Four times a day (QID) | ORAL | 0 refills | Status: DC | PRN

## 2021-06-22 NOTE — Progress Notes (Signed)
Postop medications sent 

## 2021-06-23 ENCOUNTER — Telehealth: Payer: Self-pay | Admitting: *Deleted

## 2021-06-23 ENCOUNTER — Other Ambulatory Visit: Payer: Self-pay | Admitting: Podiatry

## 2021-06-23 MED ORDER — HYDROCODONE-ACETAMINOPHEN 10-325 MG PO TABS: 1.0000 | ORAL_TABLET | ORAL | 0 refills | Status: AC | PRN

## 2021-06-23 MED ORDER — IBUPROFEN 800 MG PO TABS: 800.0000 mg | ORAL_TABLET | Freq: Three times a day (TID) | ORAL | 0 refills | Status: DC | PRN

## 2021-06-23 NOTE — Telephone Encounter (Signed)
Called and gave instructions to patient per Dr Jacqualyn Posey and to inform that an new medication has been to pharmacy on file.She verbalized understanding.

## 2021-06-23 NOTE — Telephone Encounter (Signed)
Called and spoke with patient, giving her Dr Leigh Aurora recommendations for loosening the ace and boot straps, said that they did that last night, is icing and elevating but still a lot of pain. She also said that she thinks that she has taken the Vicodin before.

## 2021-06-23 NOTE — Telephone Encounter (Signed)
Patient is calling because the hydrocodone prescribed is making her extremely sick. Can something else be called in, is in severe pain(surgery one day ago).Please advise.

## 2021-06-27 ENCOUNTER — Other Ambulatory Visit: Payer: Self-pay

## 2021-06-27 ENCOUNTER — Ambulatory Visit (INDEPENDENT_AMBULATORY_CARE_PROVIDER_SITE_OTHER): Payer: BC Managed Care – PPO | Admitting: Podiatry

## 2021-06-27 ENCOUNTER — Ambulatory Visit (INDEPENDENT_AMBULATORY_CARE_PROVIDER_SITE_OTHER): Payer: BC Managed Care – PPO

## 2021-06-27 DIAGNOSIS — M21619 Bunion of unspecified foot: Secondary | ICD-10-CM | POA: Diagnosis not present

## 2021-06-27 DIAGNOSIS — Z9889 Other specified postprocedural states: Secondary | ICD-10-CM

## 2021-06-30 NOTE — Progress Notes (Signed)
Subjective: [name] is a 50 y.o. is seen today in office s/p right foot bunionectomy preformed on 06/22/2021.  She states that her pain is better controlled.  She has been nonweightbearing.  No recent injury that she reports.  Denies any systemic complaints such as fevers, chill, nausea, vomiting. No calf pain, chest pain, shortness of breath.   Objective: General: No acute distress, AAOx3  DP/PT pulses palpable 2/4, CRT < 3 sec to all digits.  Protective sensation intact. Motor function intact.  Right foot: Incision is well coapted without any evidence of dehiscence. There is no surrounding erythema, ascending cellulitis, fluctuance, crepitus, malodor, drainage/purulence. There is mild edema around the surgical site. There is mild pain along the surgical site. Toe is in rectus position No other areas of tenderness to bilateral lower extremities.  No other open lesions or pre-ulcerative lesions.  No pain with calf compression, swelling, warmth, erythema.   Assessment and Plan:  Status post right foot bunionectomy, doing well with no complications   -Treatment options discussed including all alternatives, risks, and complications -X-rays was reviewed.  Status post first metatarsal osteotomy plate, screw fixation without any complicating factors. -Incisions clean.  Small amount of antibiotic ointment was applied followed by dressing.  Discussed later this week she can start to wash the foot with soap and water and dry thoroughly and apply a similar bandage. -Continue nonweightbearing cam boot.  We discussed range of motion exercises for the first MPJ. -Ice/elevation -Pain medication as needed. -Monitor for any clinical signs or symptoms of infection and DVT/PE and directed to call the office immediately should any occur or go to the ER. -Follow-up as scheduled for possible suture removal or sooner if any problems arise. In the meantime, encouraged to call the office with any questions,  concerns, change in symptoms.   Celesta Gentile, DPM

## 2021-07-07 ENCOUNTER — Encounter: Payer: BC Managed Care – PPO | Admitting: Podiatry

## 2021-07-08 ENCOUNTER — Ambulatory Visit (INDEPENDENT_AMBULATORY_CARE_PROVIDER_SITE_OTHER): Payer: BC Managed Care – PPO | Admitting: Podiatry

## 2021-07-08 ENCOUNTER — Other Ambulatory Visit: Payer: Self-pay

## 2021-07-08 DIAGNOSIS — K602 Anal fissure, unspecified: Secondary | ICD-10-CM | POA: Insufficient documentation

## 2021-07-08 DIAGNOSIS — K625 Hemorrhage of anus and rectum: Secondary | ICD-10-CM | POA: Insufficient documentation

## 2021-07-08 DIAGNOSIS — R142 Eructation: Secondary | ICD-10-CM | POA: Insufficient documentation

## 2021-07-08 DIAGNOSIS — M21619 Bunion of unspecified foot: Secondary | ICD-10-CM

## 2021-07-08 DIAGNOSIS — Z9889 Other specified postprocedural states: Secondary | ICD-10-CM

## 2021-07-11 NOTE — Progress Notes (Signed)
Subjective: [name] is a 51 y.o. is seen today in office s/p right foot bunionectomy preformed on 06/22/2021.  She states that she is doing better she is able to move the toes more.  She still does get some sharp aches Incision site.  No recent injuries.  Still in the cam boot.  Denies any fevers or chills.  She has no other concerns today.    Objective: General: No acute distress, AAOx3  DP/PT pulses palpable 2/4, CRT < 3 sec to all digits.  Protective sensation intact. Motor function intact.  Right foot: Incision is well coapted without any evidence of dehiscence and sutures are intact.  There is decreased edema.  No erythema or warmth.  No drainage or pus or any signs of infection coming from the incision site.  Mild discomfort palpation on exam.  Mild pain with MPJ range of motion. No other open lesions or pre-ulcerative lesions.  No pain with calf compression, swelling, warmth, erythema.   Assessment and Plan:  Status post right foot bunionectomy, doing well with no complications   -Treatment options discussed including all alternatives, risks, and complications -Sutures removed today without complications.  Incisions well coapted.  Discussed washing it with soap and water and dry thoroughly and apply a small amount of antibiotic ointment and a bandage.  Dressing was applied today. -Continue cam boot she can transition to partial weightbearing as tolerated when she feels ready.  Continue range of motion exercises.  Continue ice elevate as well.  Return in about 2 weeks (around 07/22/2021).  Repeat x-rays  Trula Slade DPM

## 2021-07-12 ENCOUNTER — Encounter: Payer: Self-pay | Admitting: Podiatry

## 2021-07-12 ENCOUNTER — Other Ambulatory Visit: Payer: Self-pay | Admitting: Podiatry

## 2021-07-12 NOTE — Telephone Encounter (Signed)
[name] can you get this patient a note as long as its ok with Dr. Jacqualyn Posey please. Thank you

## 2021-07-13 ENCOUNTER — Encounter: Payer: Self-pay | Admitting: Podiatry

## 2021-07-13 MED ORDER — IBUPROFEN 800 MG PO TABS: 800.0000 mg | ORAL_TABLET | Freq: Three times a day (TID) | ORAL | 0 refills | Status: AC | PRN

## 2021-07-21 ENCOUNTER — Other Ambulatory Visit: Payer: Self-pay

## 2021-07-21 ENCOUNTER — Ambulatory Visit (INDEPENDENT_AMBULATORY_CARE_PROVIDER_SITE_OTHER): Payer: BC Managed Care – PPO | Admitting: Podiatry

## 2021-07-21 ENCOUNTER — Ambulatory Visit (INDEPENDENT_AMBULATORY_CARE_PROVIDER_SITE_OTHER): Payer: BC Managed Care – PPO

## 2021-07-21 DIAGNOSIS — Z9889 Other specified postprocedural states: Secondary | ICD-10-CM

## 2021-07-21 DIAGNOSIS — M21619 Bunion of unspecified foot: Secondary | ICD-10-CM

## 2021-07-26 NOTE — Progress Notes (Signed)
Subjective: [name] is a 51 y.o. is seen today in office s/p right foot bunionectomy preformed on 06/22/2021.  States that she has been putting some pressure on the foot while in the cam boot but she is having some discomfort with this.  She is asking about driving as well as returning back to work.  Denies any fevers or chills.  No recent injury or falls.  She is no other concerns today.  Objective: General: No acute distress, AAOx3  DP/PT pulses palpable 2/4, CRT < 3 sec to all digits.  Protective sensation intact. Motor function intact.  Right foot: Incision is well coapted without any evidence of dehiscence and scars forming.  There is no evidence of dehiscence, drainage or pus or any signs of infection.  No significant injury to motion but she does have tenderness along the surgical site no other areas of discomfort identified. No other open lesions or pre-ulcerative lesions.  No pain with calf compression, swelling, warmth, erythema.   Assessment and Plan:  Status post right foot bunionectomy, doing well with no complications   -Treatment options discussed including all alternatives, risks, and complications -X-rays obtained reviewed.  Hardware intact any complicating factors status post bunionectomy. -This point refer to physical therapy.  Will refer to [facility] physical therapy.  Discussed that she can transition to weightbearing as tolerated in the cam boot.  Then she can start to drive and get the boot off to drive.  As she progresses with physical therapy she can transition to regular shoe. -Continue ice and elevate.  Compression to help with any postoperative edema.  Return in about 2 weeks (around 08/04/2021).  Trula Slade DPM

## 2021-08-01 ENCOUNTER — Encounter: Payer: BC Managed Care – PPO | Admitting: Podiatry

## 2021-08-08 ENCOUNTER — Other Ambulatory Visit: Payer: Self-pay

## 2021-08-08 ENCOUNTER — Ambulatory Visit (INDEPENDENT_AMBULATORY_CARE_PROVIDER_SITE_OTHER): Payer: BC Managed Care – PPO | Admitting: Podiatry

## 2021-08-08 ENCOUNTER — Ambulatory Visit (INDEPENDENT_AMBULATORY_CARE_PROVIDER_SITE_OTHER): Payer: BC Managed Care – PPO

## 2021-08-08 DIAGNOSIS — M21619 Bunion of unspecified foot: Secondary | ICD-10-CM

## 2021-08-08 DIAGNOSIS — Z9889 Other specified postprocedural states: Secondary | ICD-10-CM

## 2021-08-08 NOTE — Telephone Encounter (Signed)
I just saw [name]. She said the company needed more information. Can you please assist her with this? Thanks!

## 2021-08-10 NOTE — Progress Notes (Signed)
Subjective: [name] is a 51 y.o. is seen today in office s/p right foot bunionectomy preformed on 06/22/2021.  States that she is improving.  She is still walking the cam boot.  She is still in physical therapy.  Still denies any swelling.  No recent injury or changes otherwise since.  Denies any fevers or chills.  Objective: General: No acute distress, AAOx3  DP/PT pulses palpable 2/4, CRT < 3 sec to all digits.  Protective sensation intact. Motor function intact.  Right foot: Incision is well coapted without any evidence of dehiscence and scar is formed.  There is tenderness palpation directly along the incision site.  Slight discomfort with MPJ range of motion.  Mild edema still evident but appears to be improved.  There is no erythema or warmth.  No signs of infection noted today. No other open lesions or pre-ulcerative lesions.  No pain with calf compression, swelling, warmth, erythema.   Assessment and Plan:  Status post right foot bunionectomy, doing well with no complications   -Treatment options discussed including all alternatives, risks, and complications -X-rays obtained reviewed.  Hardware intact with any complicating factors.  Status post bunionectomy -Continue physical therapy for now.  Continue to ice and elevate.  Continue with compression.  As she continues with physical therapy she can transition to regular shoe as tolerated on a gradual basis.  Return in about 4 weeks (around 09/05/2021).  Trula Slade DPM

## 2021-08-31 ENCOUNTER — Other Ambulatory Visit: Payer: Self-pay

## 2021-08-31 ENCOUNTER — Encounter (HOSPITAL_BASED_OUTPATIENT_CLINIC_OR_DEPARTMENT_OTHER): Payer: Self-pay | Admitting: Obstetrics and Gynecology

## 2021-08-31 DIAGNOSIS — Z9989 Dependence on other enabling machines and devices: Secondary | ICD-10-CM

## 2021-08-31 DIAGNOSIS — Z973 Presence of spectacles and contact lenses: Secondary | ICD-10-CM

## 2021-08-31 HISTORY — DX: Dependence on other enabling machines and devices: Z99.89

## 2021-08-31 HISTORY — DX: Presence of spectacles and contact lenses: Z97.3

## 2021-08-31 NOTE — Progress Notes (Addendum)
Spoke w/ via phone for pre-op interview---PT Lab needs dos----      cbc , upt         Lab results------none COVID test -----patient states asymptomatic no test needed Arrive at -------915 am 09-02-2021 NPO after MN NO Solid Food.  Clear liquids from MN until---815 am Med rec completed Medications to take morning of surgery -----buspar Patient instructed no nail polish to be worn day of surgery pt has clear acrylic nails and will leave on Patient instructed to bring photo id and insurance card day of surgery Patient aware to have Driver (ride ) / caregiver   husband jeff jones cell [phone]  for 24 hours after surgery  Patient Special Instructions -----none Pre-Op special Istructions -----none Patient verbalized understanding of instructions that were given at this phone interview. Patient denies shortness of breath, chest pain, fever, cough at this phone interview.

## 2021-09-02 ENCOUNTER — Other Ambulatory Visit: Payer: Self-pay

## 2021-09-02 ENCOUNTER — Encounter (HOSPITAL_BASED_OUTPATIENT_CLINIC_OR_DEPARTMENT_OTHER): Payer: Self-pay | Admitting: Obstetrics and Gynecology

## 2021-09-02 ENCOUNTER — Other Ambulatory Visit: Payer: Self-pay | Admitting: Obstetrics and Gynecology

## 2021-09-02 ENCOUNTER — Ambulatory Visit (HOSPITAL_BASED_OUTPATIENT_CLINIC_OR_DEPARTMENT_OTHER): Payer: BC Managed Care – PPO | Admitting: Certified Registered"

## 2021-09-02 ENCOUNTER — Ambulatory Visit (HOSPITAL_BASED_OUTPATIENT_CLINIC_OR_DEPARTMENT_OTHER)
Admission: RE | Admit: 2021-09-02 | Discharge: 2021-09-02 | Disposition: A | Discharge status: Home / Self Care | Payer: BC Managed Care – PPO | Attending: Obstetrics and Gynecology | Admitting: Obstetrics and Gynecology

## 2021-09-02 ENCOUNTER — Encounter (HOSPITAL_BASED_OUTPATIENT_CLINIC_OR_DEPARTMENT_OTHER)
Admission: RE | Disposition: A | Discharge status: Home / Self Care | Payer: Self-pay | Source: Home / Self Care | Attending: Obstetrics and Gynecology

## 2021-09-02 DIAGNOSIS — D259 Leiomyoma of uterus, unspecified: Secondary | ICD-10-CM | POA: Insufficient documentation

## 2021-09-02 DIAGNOSIS — N921 Excessive and frequent menstruation with irregular cycle: Secondary | ICD-10-CM | POA: Insufficient documentation

## 2021-09-02 DIAGNOSIS — K219 Gastro-esophageal reflux disease without esophagitis: Secondary | ICD-10-CM | POA: Diagnosis not present

## 2021-09-02 DIAGNOSIS — R141 Gas pain: Secondary | ICD-10-CM

## 2021-09-02 DIAGNOSIS — R142 Eructation: Secondary | ICD-10-CM

## 2021-09-02 DIAGNOSIS — D250 Submucous leiomyoma of uterus: Secondary | ICD-10-CM

## 2021-09-02 DIAGNOSIS — N840 Polyp of corpus uteri: Secondary | ICD-10-CM | POA: Diagnosis not present

## 2021-09-02 DIAGNOSIS — F419 Anxiety disorder, unspecified: Secondary | ICD-10-CM | POA: Diagnosis not present

## 2021-09-02 HISTORY — PX: HYSTEROSCOPY WITH D & C: SHX1775

## 2021-09-02 HISTORY — DX: Attention-deficit hyperactivity disorder, unspecified type: F90.9

## 2021-09-02 HISTORY — DX: Anxiety disorder, unspecified: F41.9

## 2021-09-02 HISTORY — DX: Unspecified osteoarthritis, unspecified site: M19.90

## 2021-09-02 LAB — CBC
HCT: 40.4 % (ref 36.0–46.0)
Hemoglobin: 13.8 g/dL (ref 12.0–15.0)
MCH: 32 pg (ref 26.0–34.0)
MCHC: 34.2 g/dL (ref 30.0–36.0)
MCV: 93.7 fL (ref 80.0–100.0)
Platelets: 324 10*3/uL (ref 150–400)
RBC: 4.31 MIL/uL (ref 3.87–5.11)
RDW: 12.3 % (ref 11.5–15.5)
WBC: 5.5 10*3/uL (ref 4.0–10.5)
nRBC: 0 % (ref 0.0–0.2)

## 2021-09-02 LAB — POCT PREGNANCY, URINE: Preg Test, Ur: NEGATIVE

## 2021-09-02 SURGERY — RADIOFREQUENCY ABLATION, LEIOMYOMA, UTERUS, TRANSCERVICAL APPROACH, WITH US GUIDANCE
Anesthesia: General | Site: Vagina

## 2021-09-02 MED ORDER — FENTANYL CITRATE (PF) 100 MCG/2ML IJ SOLN
25.0000 ug | INTRAMUSCULAR | Status: DC | PRN
  Administered 2021-09-02 (×3): 25 ug via INTRAVENOUS

## 2021-09-02 MED ORDER — ONDANSETRON HCL 4 MG/2ML IJ SOLN
INTRAMUSCULAR | Status: AC
  Filled 2021-09-02: qty 2

## 2021-09-02 MED ORDER — POVIDONE-IODINE 10 % EX SWAB: 2.0000 "application " | Freq: Once | CUTANEOUS | Status: DC

## 2021-09-02 MED ORDER — MIDAZOLAM HCL 5 MG/5ML IJ SOLN
INTRAMUSCULAR | Status: DC | PRN
Start: 2021-09-02 — End: 2021-09-02
  Administered 2021-09-02: 2 mg via INTRAVENOUS

## 2021-09-02 MED ORDER — STERILE WATER FOR IRRIGATION IR SOLN
Status: DC | PRN
  Administered 2021-09-02: 500 mL

## 2021-09-02 MED ORDER — DEXAMETHASONE SODIUM PHOSPHATE 10 MG/ML IJ SOLN
INTRAMUSCULAR | Status: DC | PRN
Start: 2021-09-02 — End: 2021-09-02
  Administered 2021-09-02: 10 mg via INTRAVENOUS

## 2021-09-02 MED ORDER — LACTATED RINGERS IV SOLN: INTRAVENOUS | Status: DC

## 2021-09-02 MED ORDER — PROPOFOL 10 MG/ML IV BOLUS
INTRAVENOUS | Status: DC | PRN
  Administered 2021-09-02: 160 mg via INTRAVENOUS

## 2021-09-02 MED ORDER — IBUPROFEN 600 MG PO TABS: 600.0000 mg | ORAL_TABLET | Freq: Four times a day (QID) | ORAL | 11 refills | Status: AC | PRN

## 2021-09-02 MED ORDER — SOD CITRATE-CITRIC ACID 500-334 MG/5ML PO SOLN: 30.0000 mL | ORAL | Status: DC

## 2021-09-02 MED ORDER — KETOROLAC TROMETHAMINE 30 MG/ML IJ SOLN
INTRAMUSCULAR | Status: AC
  Filled 2021-09-02: qty 1

## 2021-09-02 MED ORDER — FENTANYL CITRATE (PF) 100 MCG/2ML IJ SOLN
INTRAMUSCULAR | Status: AC
Start: 2021-09-02 — End: ?
  Filled 2021-09-02: qty 2

## 2021-09-02 MED ORDER — SODIUM CHLORIDE 0.9 % IR SOLN
Status: DC | PRN
  Administered 2021-09-02: 3000 mL

## 2021-09-02 MED ORDER — MIDAZOLAM HCL 2 MG/2ML IJ SOLN
INTRAMUSCULAR | Status: AC
Start: 2021-09-02 — End: ?
  Filled 2021-09-02: qty 2

## 2021-09-02 MED ORDER — FENTANYL CITRATE (PF) 100 MCG/2ML IJ SOLN
INTRAMUSCULAR | Status: DC | PRN
  Administered 2021-09-02: 25 ug via INTRAVENOUS
  Administered 2021-09-02: 50 ug via INTRAVENOUS

## 2021-09-02 MED ORDER — LIDOCAINE HCL (PF) 2 % IJ SOLN
INTRAMUSCULAR | Status: AC
  Filled 2021-09-02: qty 5

## 2021-09-02 MED ORDER — KETOROLAC TROMETHAMINE 30 MG/ML IJ SOLN
INTRAMUSCULAR | Status: DC | PRN
  Administered 2021-09-02: 30 mg via INTRAVENOUS

## 2021-09-02 MED ORDER — ONDANSETRON HCL 4 MG/2ML IJ SOLN: 4.0000 mg | Freq: Once | INTRAMUSCULAR | Status: DC | PRN

## 2021-09-02 MED ORDER — LIDOCAINE 2% (20 MG/ML) 5 ML SYRINGE
INTRAMUSCULAR | Status: DC | PRN
  Administered 2021-09-02: 100 mg via INTRAVENOUS

## 2021-09-02 MED ORDER — KETOROLAC TROMETHAMINE 30 MG/ML IJ SOLN: 30.0000 mg | Freq: Once | INTRAMUSCULAR | Status: DC | PRN

## 2021-09-02 MED ORDER — ONDANSETRON HCL 4 MG/2ML IJ SOLN
INTRAMUSCULAR | Status: DC | PRN
  Administered 2021-09-02: 4 mg via INTRAVENOUS

## 2021-09-02 MED ORDER — DEXAMETHASONE SODIUM PHOSPHATE 10 MG/ML IJ SOLN
INTRAMUSCULAR | Status: AC
  Filled 2021-09-02: qty 1

## 2021-09-02 SURGICAL SUPPLY — 22 items
DEVICE MYOSURE REACH (MISCELLANEOUS) ×1 IMPLANT
DRSG TELFA 3X8 NADH (GAUZE/BANDAGES/DRESSINGS) ×3 IMPLANT
ELECT DISPERSIVE SONATA (ELECTRODE) ×6 IMPLANT
GAUZE 4X4 16PLY ~~LOC~~+RFID DBL (SPONGE) ×3 IMPLANT
GLOVE SURG LTX SZ6.5 (GLOVE) ×3 IMPLANT
GLOVE SURG POLYISO LF SZ7 (GLOVE) ×1 IMPLANT
GLOVE SURG POLYISO LF SZ7.5 (GLOVE) ×1 IMPLANT
GLOVE SURG UNDER POLY LF SZ7 (GLOVE) ×4 IMPLANT
GOWN STRL REUS W/TWL LRG LVL3 (GOWN DISPOSABLE) ×4 IMPLANT
HANDPIECE RFA SONATA (MISCELLANEOUS) ×3 IMPLANT
IV NS 1000ML (IV SOLUTION) ×3
IV NS 1000ML BAXH (IV SOLUTION) IMPLANT
KIT PROCEDURE FLUENT (KITS) ×3 IMPLANT
KIT TURNOVER CYSTO (KITS) ×3 IMPLANT
PACK VAGINAL MINOR WOMEN LF (CUSTOM PROCEDURE TRAY) ×3 IMPLANT
PAD DRESSING TELFA 3X8 NADH (GAUZE/BANDAGES/DRESSINGS) ×2 IMPLANT
PAD OB MATERNITY 4.3X12.25 (PERSONAL CARE ITEMS) ×3 IMPLANT
PAD PREP 24X48 CUFFED NSTRL (MISCELLANEOUS) ×3 IMPLANT
SEAL ROD LENS SCOPE MYOSURE (ABLATOR) ×3 IMPLANT
SYR 50ML LL SCALE MARK (SYRINGE) ×3 IMPLANT
TOWEL OR 17X26 10 PK STRL BLUE (TOWEL DISPOSABLE) ×3 IMPLANT
WATER STERILE IRR 500ML POUR (IV SOLUTION) ×3 IMPLANT

## 2021-09-02 NOTE — Anesthesia Postprocedure Evaluation (Signed)
Anesthesia Post Note  Patient: [name]  Procedure(s) Performed: radiofrequency transcervical ablation of uterine fibroids using Sonata (Vagina ) Hysteroscopic resection of endometrial polyp using myosure, dilation and curettage (Vagina )     Patient location during evaluation: PACU Anesthesia Type: General Level of consciousness: awake and alert Pain management: pain level controlled Vital Signs Assessment: post-procedure vital signs reviewed and stable Respiratory status: spontaneous breathing, nonlabored ventilation, respiratory function stable and patient connected to nasal cannula oxygen Cardiovascular status: blood pressure returned to baseline and stable Postop Assessment: no apparent nausea or vomiting Anesthetic complications: no   No notable events documented.  Last Vitals:  Vitals:   09/02/21 1434 09/02/21 1435  BP:  121/76  Pulse: 73 68  Resp: 13 16  Temp:    SpO2: 100% 100%    Last Pain:  Vitals:   09/02/21 0945  TempSrc: Oral  PainSc: 5                  [name]

## 2021-09-02 NOTE — Transfer of Care (Signed)
Immediate Anesthesia Transfer of Care Note  Patient: [name]  Procedure(s) Performed: radiofrequency transcervical ablation of uterine fibroids using Sonata (Vagina ) Hysteroscopic resection of endometrial polyp using myosure, dilation and curettage (Vagina )  Patient Location: PACU  Anesthesia Type:General  Level of Consciousness: drowsy and patient cooperative  Airway & Oxygen Therapy: Patient Spontanous Breathing and Patient connected to face mask oxygen  Post-op Assessment: Report given to RN and Post -op Vital signs reviewed and stable  Post vital signs: Reviewed and stable  Last Vitals:  Vitals Value Taken Time  BP    Temp    Pulse 71 09/02/21 1436  Resp 13 09/02/21 1436  SpO2 100 % 09/02/21 1436  Vitals shown include unvalidated device data.  Last Pain:  Vitals:   09/02/21 0945  TempSrc: Oral  PainSc: 5       Patients Stated Pain Goal: 4 (22/29/79 8921)  Complications: No notable events documented.

## 2021-09-02 NOTE — H&P (Signed)
[name] is an 51 y.o. female. G4P4 MBF presents for surgical mgmt of symptomatic uterine fibroid,   Pertinent Gynecological History: Menses: flow is excessive with use of 6 pads or tampons on heaviest days Bleeding: menorrhagia Contraception: none DES exposure: denies Blood transfusions: none Sexually transmitted diseases: no past history Previous GYN Procedures:  dx laparoscopy   Last mammogram: normal Date: 2022 Last pap: normal Date: 2022 OB History: G4, P4   Menstrual History: Menarche age: n/Laurie Patient's last menstrual period was 08/28/2021.    Past Medical History:  Diagnosis Date   ADHD (attention deficit hyperactivity disorder)    AMA (advanced maternal age) multigravida 35+    Anxiety    Fibroid, uterine    GERD (gastroesophageal reflux disease)    Heart murmur mild no cardiologist    History of chicken pox    as child   history of palpitations managed by pcp no cardiologist no medications taken    palpitations   Hyperemesis gravidarum    migraines    Obesity    Osteo Arthritis both knees    Ovarian cyst    Pregnancy with history of pre-term labor    Sinusitis 06/08/2011   Use of cane as ambulatory aid 08/31/2021   Uterine fibroid    Wears glasses 08/31/2021    Past Surgical History:  Procedure Laterality Date   LAPAROSCOPY     adhesions to remove cysts yrs ago   RIGHT FOOT BUNIONECTOMY WITH PIN AND PLATE INSERTED  37/85/8850   DR Earleen Newport @ [facility] [address]   rt shoulder surgery     yrs ago pin placed and is still present   TONSILLECTOMY     age 65    Family History  Problem Relation Age of Onset   Hypothyroidism Mother    Diabetes Father    Hypertension Father    Diabetes Sister    Hypertension Sister    Cancer Maternal Grandmother        throat   Anesthesia problems Neg Hx     Social History:  reports that she has never smoked. She has never used smokeless tobacco. She reports that she does not drink alcohol and  does not use drugs.  Allergies:  Allergies  Allergen Reactions   Oxycodone-Acetaminophen Nausea And Vomiting    Would prefer not to take    Medications Prior to Admission  Medication Sig Dispense Refill Last Dose   HYDROcodone-acetaminophen (NORCO/VICODIN) 5-325 MG tablet Take 1 tablet by mouth every 6 (six) hours as needed for moderate pain. Has some left from June 22 2021 surgery   Past Month   hydrOXYzine (ATARAX) 25 MG tablet Take 25-50 mg by mouth at bedtime.   09/01/2021   ibuprofen (ADVIL) 800 MG tablet Take 1 tablet (800 mg total) by mouth every 8 (eight) hours as needed. 30 tablet 0 08/30/2021 at 2000   promethazine (PHENERGAN) 25 MG tablet Take 1 tablet (25 mg total) by mouth every 8 (eight) hours as needed for nausea or vomiting. 20 tablet 0 Past Month   SUMAtriptan (IMITREX) 25 MG tablet Take 25 mg by mouth every 2 (two) hours as needed for migraine. May repeat in 2 hours if headache persists or recurs.   Past Week   amphetamine-dextroamphetamine (ADDERALL XR) 15 MG 24 hr capsule Take by mouth every morning.   08/31/2021   busPIRone (BUSPAR) 7.5 MG tablet Take 7.5 mg by mouth 2 (two) times daily.   08/31/2021   Cetirizine HCl  10 MG CAPS Take by mouth at bedtime.   08/31/2021   Multiple Vitamin tablet Take 1 tablet by mouth daily.   08/31/2021    Review of Systems  All other systems reviewed and are negative.  Blood pressure 121/80, pulse 71, temperature 98.8 F (37.1 C), temperature source Oral, resp. rate 17, height 5\' 4"  (1.626 m), weight 78 kg, last menstrual period 08/28/2021, SpO2 99 %. Physical Exam Constitutional:      Appearance: Normal appearance.  Eyes:     Extraocular Movements: Extraocular movements intact.  Cardiovascular:     Rate and Rhythm: Regular rhythm.     Heart sounds: Normal heart sounds.  Pulmonary:     Breath sounds: Normal breath sounds.  Abdominal:     Palpations: Abdomen is soft.  Genitourinary:    General: Normal vulva.     Comments:  Uterus 10 wk irreg Adnexa nl Cervix parous Musculoskeletal:        General: Normal range of motion.     Cervical back: Neck supple.  Skin:    General: Skin is warm and dry.  Neurological:     General: No focal deficit present.     Mental Status: She is alert and oriented to person, place, and time.  Psychiatric:        Mood and Affect: Mood normal.        Behavior: Behavior normal.    Results for orders placed or performed during the hospital encounter of 09/02/21 (from the past 24 hour(s))  Pregnancy, urine POC     Status: None   Collection Time: 09/02/21  9:28 AM  Result Value Ref Range   Preg Test, Ur NEGATIVE NEGATIVE  CBC     Status: None   Collection Time: 09/02/21  9:55 AM  Result Value Ref Range   WBC 5.5 4.0 - 10.5 K/uL   RBC 4.31 3.87 - 5.11 MIL/uL   Hemoglobin 13.8 12.0 - 15.0 g/dL   HCT 40.4 36.0 - 46.0 %   MCV 93.7 80.0 - 100.0 fL   MCH 32.0 26.0 - 34.0 pg   MCHC 34.2 30.0 - 36.0 g/dL   RDW 12.3 11.5 - 15.5 %   Platelets 324 150 - 400 K/uL   nRBC 0.0 0.0 - 0.2 %    No results found.  Assessment/Plan: Menorrhagia Fibroid uterus P)RF transcervical ablation of uterine fibroid, diagnostic hysteroscopy, hysteroscopic resection, D&C. Procedure explained. Risk of surgery reviewed including infection, bleeding, injury to surrounding organ structures, thermal injury, uterine perforation and its risk, all ? answered  [name] [name] 09/02/2021, 12:55 PM

## 2021-09-02 NOTE — Brief Op Note (Signed)
09/02/2021  2:31 PM  PATIENT:  [name]  51 y.o. female  PRE-OPERATIVE DIAGNOSIS:  menorrhagia with regular cycles , uterine fibroids, endometrial mass/polyp  POST-OPERATIVE DIAGNOSIS:  menorrhagia with regular cycles , uterine fibroids, adenomyosis  PROCEDURE:  Radiofrequency transcervical ablation of uterine fibroids using Sonata, diagnostic hysteroscopy, hysteroscopic resection of endometrium, dilation and curettage  SURGEON:  Surgeon(s) and Role:    * Servando Salina, MD - Primary  PHYSICIAN ASSISTANT:   ASSISTANTS: none   ANESTHESIA:   general Findings: tubal ostia seen, lus thickened endometrium, fundal adenomyosis/oma, IM/SS fibroids EBL:  5 mL   BLOOD ADMINISTERED:none  DRAINS: none   LOCAL MEDICATIONS USED:  NONE  SPECIMEN:  Source of Specimen:  emc with polyp  DISPOSITION OF SPECIMEN:  PATHOLOGY  COUNTS:  YES  TOURNIQUET:  * No tourniquets in log *  DICTATION: .Other Dictation: Dictation Number [id number]  PLAN OF CARE: Discharge to home after PACU  PATIENT DISPOSITION:  PACU - hemodynamically stable.   Delay start of Pharmacological VTE agent (>24hrs) due to surgical blood loss or risk of bleeding: no

## 2021-09-02 NOTE — Anesthesia Preprocedure Evaluation (Signed)
Anesthesia Evaluation  Patient identified by MRN, date of birth, ID band Patient awake    Reviewed: Allergy & Precautions, NPO status , Patient's Chart, lab work & pertinent test results  Airway Mallampati: II  TM Distance: >3 FB Neck ROM: Full    Dental no notable dental hx.    Pulmonary neg pulmonary ROS,    Pulmonary exam normal breath sounds clear to auscultation       Cardiovascular negative cardio ROS Normal cardiovascular exam Rhythm:Regular Rate:Normal     Neuro/Psych Anxiety negative neurological ROS     GI/Hepatic Neg liver ROS, GERD  ,  Endo/Other  negative endocrine ROS  Renal/GU negative Renal ROS  negative genitourinary   Musculoskeletal negative musculoskeletal ROS (+)   Abdominal   Peds negative pediatric ROS (+)  Hematology negative hematology ROS (+)   Anesthesia Other Findings   Reproductive/Obstetrics negative OB ROS                             Anesthesia Physical Anesthesia Plan  ASA: 2  Anesthesia Plan: General   Post-op Pain Management: Minimal or no pain anticipated   Induction: Intravenous  PONV Risk Score and Plan: 3 and Ondansetron, Dexamethasone, Midazolam and Treatment may vary due to age or medical condition  Airway Management Planned: LMA  Additional Equipment:   Intra-op Plan:   Post-operative Plan: Extubation in OR  Informed Consent: I have reviewed the patients History and Physical, chart, labs and discussed the procedure including the risks, benefits and alternatives for the proposed anesthesia with the patient or authorized representative who has indicated his/her understanding and acceptance.     Dental advisory given  Plan Discussed with: CRNA and Surgeon  Anesthesia Plan Comments:         Anesthesia Quick Evaluation

## 2021-09-02 NOTE — Discharge Instructions (Addendum)
°  Post Anesthesia Home Care Instructions  **You may begin taking Ibuprofen, Motrin, Advil or other NSAIDs after 7:30 pm**  Activity: Get plenty of rest for the remainder of the day. A responsible adult should stay with you for 24 hours following the procedure.  For the next 24 hours, DO NOT: -Drive a car -Paediatric nurse -Drink alcoholic beverages -Take any medication unless instructed by your physician -Make any legal decisions or sign important papers.  Meals: Start with liquid foods such as gelatin or soup. Progress to regular foods as tolerated. Avoid greasy, spicy, heavy foods. If nausea and/or vomiting occur, drink only clear liquids until the nausea and/or vomiting subsides. Call your physician if vomiting continues.  Special Instructions/Symptoms: Your throat may feel dry or sore from the anesthesia or the breathing tube placed in your throat during surgery. If this causes discomfort, gargle with warm salt water. The discomfort should disappear within 24 hours.  DISCHARGE INSTRUCTIONS: HYSTEROSCOPY / ENDOMETRIAL ABLATION The following instructions have been prepared to help you care for yourself upon your return home.  May Remove Scop patch on or before  May take Ibuprofen after  May take stool softner while taking narcotic pain medication to prevent constipation.  Drink plenty of water.  Personal hygiene:  Use sanitary pads for vaginal drainage, not tampons.  Shower the day after your procedure.  NO tub baths, pools or Jacuzzis for 2-3 weeks.  Wipe front to back after using the bathroom.  Activity and limitations:  Do NOT drive or operate any equipment for 24 hours. The effects of anesthesia are still present and drowsiness may result.  Do NOT rest in bed all day.  Walking is encouraged.  Walk up and down stairs slowly.  You may resume your normal activity in one to two days or as indicated by your physician. Sexual activity: NO intercourse or anything in the  vagina for 2 weeks after the procedure.  Diet: Eat a light meal as desired this evening. You may resume your usual diet tomorrow.  Return to Work: You may resume your work activities in one to two days or as indicated by Marine scientist.  What to expect after your surgery: Expect to have vaginal bleeding/discharge for 2-3 days and spotting for up to 10 days. It is not unusual to have soreness for up to 1-2 weeks. You may have a slight burning sensation when you urinate for the first day. Mild cramps may continue for a couple of days. You may have a regular period in 2-6 weeks.  Call your doctor for any of the following:  Excessive vaginal bleeding or clotting, saturating and changing one pad every hour.  Inability to urinate 6 hours after discharge from hospital.  Pain not relieved by pain medication.  Fever of 100.4 F or greater.  Unusual vaginal discharge or odor.

## 2021-09-02 NOTE — Anesthesia Procedure Notes (Signed)
Procedure Name: LMA Insertion Date/Time: 09/02/2021 1:26 PM Performed by: Gwyndolyn Saxon, CRNA Pre-anesthesia Checklist: Patient identified, Emergency Drugs available, Suction available and Patient being monitored Patient Re-evaluated:Patient Re-evaluated prior to induction Oxygen Delivery Method: Circle system utilized Preoxygenation: Pre-oxygenation with 100% oxygen Induction Type: IV induction Ventilation: Mask ventilation without difficulty LMA: LMA inserted LMA Size: 4.0 Number of attempts: 1 Placement Confirmation: positive ETCO2 and breath sounds checked- equal and bilateral Tube secured with: Tape Dental Injury: Teeth and Oropharynx as per pre-operative assessment

## 2021-09-03 NOTE — Op Note (Signed)
NAME: [name], KRAMM MEDICAL RECORD NO: [id number] ACCOUNT NO: [id number] DATE OF BIRTH: 10/28/1970 [facility] LOCATION: [facility] PHYSICIAN: Lolamae Voisin A. Garwin Brothers, MD  Operative Report   DATE OF PROCEDURE: 09/02/2021  PREOPERATIVE DIAGNOSES:  Menorrhagia, irregular cycles, uterine fibroids, endometrial polyp.  PROCEDURE:  Radiofrequency transcervical ablation of uterine fibroids using Sonata, diagnostic hysteroscopy,  hysteroscopic resection, D and C.  POSTOPERATIVE DIAGNOSES:  Menorrhagia with regular cycles, uterine fibroids.  ANESTHESIA:  General.  SURGEON:  Tricia Oaxaca A. Garwin Brothers, MD  FIRST ASSISTANT:  None.  DESCRIPTION OF PROCEDURE:  Under adequate general anesthesia, the patient was placed in the dorsal lithotomy position.  She was sterilely prepped and draped in the usual fashion.  The patient had voided prior to entering the room.  Examination under  anesthesia revealed irregular shaped anteverted uterus.  No adnexal masses could be appreciated.  Bivalve speculum was placed in the vagina.  A single-tooth tenaculum was placed on the anterior lip of the cervix.  The cervix accepted a #17 Pratt dilator.   A diagnostic hysteroscope was introduced into the uterine cavity.  Both tubal ostias were seen.  No lesions were obviously noted. Thickened endometrium in lower uterine segment was noted.  Hysteroscope was removed.  The cervix was further dilated with  #27 [place] dilator.  The Sonata treatment device with integrated ultrasound imaging with radiofrequency energy delivery was inserted transcervically into the uterine cavity.  The fibroids were identified with the ultrasound probe.  The sizes of the  fibroids were noted.  Three fundal fibroid clusters noted Type 4 at the 12 o'clock position were ablated : the following ablation sizes 2.7 x 2.0 cm for a time of 84min 30 sec; 2.6 x 1.9 cm for 2 min 18 sec and 2.5 x 1.8 cm for 2 min 12 sec.  2 were otherwise noted fundally.  Another fibroid 1.5 cm Type 3 at 5 oclock  size 2.6 x 1.9 cm with ablation time of 2 min 18 sec was also performed.  Under direct ultrasound intrauterine guidance and visualization with the ablation guide not close to the serosa of the surrounding  Fibroids was performed.  The fibroids were treated ablated with ultrasound guidance with outgassing noted by the ultrasound appearance.  Several fibroids were ablated and areas close to the endometrium with location of the fibroid was also ablated.  Once the ablation  was completed for all fibroids that was possible, the panoramic inspection of the uterus was noted to show good application.  Ultimately, Sonata apparatus was then removed.  Hysteroscopy was then reinserted.  A small ablative area was  noted in the  fundus of the endometrium; however, fertility was not desired.  The Reach resectoscope was then used to resect the endometrial thickness throughout the cavity and the procedure was complete when all instruments were then subsequently  removed from the vagina.  Specimen labeled endometrial curettings with possible polyp was sent to Pathology.  Estimated blood loss was 5 mL.  Fluid deficit was 450 mL. Complication was none.  The patient tolerated the procedure well and was transferred to recovery room in stable condition.   NIK D: 09/03/2021 12:04:46 am T: 09/03/2021 1:41:00 am  JOB: [id number]/ [id number]

## 2021-09-05 ENCOUNTER — Encounter (HOSPITAL_BASED_OUTPATIENT_CLINIC_OR_DEPARTMENT_OTHER): Payer: Self-pay | Admitting: Obstetrics and Gynecology

## 2021-09-05 LAB — SURGICAL PATHOLOGY

## 2021-09-09 ENCOUNTER — Ambulatory Visit (INDEPENDENT_AMBULATORY_CARE_PROVIDER_SITE_OTHER): Payer: BC Managed Care – PPO | Admitting: Podiatry

## 2021-09-09 ENCOUNTER — Ambulatory Visit (INDEPENDENT_AMBULATORY_CARE_PROVIDER_SITE_OTHER): Payer: BC Managed Care – PPO

## 2021-09-09 ENCOUNTER — Other Ambulatory Visit: Payer: Self-pay

## 2021-09-09 ENCOUNTER — Encounter: Payer: Self-pay | Admitting: Podiatry

## 2021-09-09 DIAGNOSIS — Z9889 Other specified postprocedural states: Secondary | ICD-10-CM

## 2021-09-09 DIAGNOSIS — M722 Plantar fascial fibromatosis: Secondary | ICD-10-CM

## 2021-09-09 DIAGNOSIS — M21619 Bunion of unspecified foot: Secondary | ICD-10-CM

## 2021-09-09 DIAGNOSIS — M7731 Calcaneal spur, right foot: Secondary | ICD-10-CM

## 2021-09-09 NOTE — Progress Notes (Signed)
SITUATION ?Reason for Consult: Evaluation for Bilateral Custom Foot Orthoses ?Patient / Caregiver Report: Patient is ready for foot orthotics ? ?OBJECTIVE DATA: ?Patient History / Diagnosis:  ?  ICD-10-CM   ?1. Status post right foot surgery  Z98.890   ?  ?2. Bunion  M21.619   ?  ?3. Plantar fasciitis  M72.2   ?  ?4. Calcaneal spur of right foot  M77.31   ?  ? ? ?Current or Previous Devices: None and no history ? ?Foot Examination: ?Skin presentation:   Intact ?Ulcers & Callousing:   None and no history ?Toe / Foot Deformities:  Hallux Valgus ?Weight Bearing Presentation:  Rectus ?Sensation:    Intact ? ?ORTHOTIC RECOMMENDATION ?Recommended Device: 1x pair of custom functional foot orthotics ? ?GOALS OF ORTHOSES ?- Reduce Pain ?- Prevent Foot Deformity ?- Prevent Progression of Further Foot Deformity ?- Relieve Pressure ?- Improve the Overall Biomechanical Function of the Foot and Lower Extremity. ? ?ACTIONS PERFORMED ?Patient was casted for Foot Orthoses via crush box. Procedure was explained and patient tolerated procedure well. All questions were answered and concerns addressed. ? ?PLAN ?Potential out of pocket cost was communicated to patient. Casts are to be sent to [facility] for fabrication. Patient is to be called for fitting when devices are ready.  ? ? ?

## 2021-09-12 ENCOUNTER — Telehealth: Payer: Self-pay

## 2021-09-12 ENCOUNTER — Encounter: Payer: Self-pay | Admitting: Podiatry

## 2021-09-12 NOTE — Telephone Encounter (Signed)
Casts sent to [employer] ?

## 2021-09-17 NOTE — Progress Notes (Signed)
Subjective: ?[name] is a 51 y.o. is seen today in office s/p right foot bunionectomy preformed on 06/22/2021.  She is back to wearing regular shoe.  She is also returning to work on February 15.  She states that while she was at work she could not not do heavy lifting and she is doing much walking as the foot is still swell causing discomfort.  It has been improving although she still having symptoms.  No recent injuries or changes otherwise.  She has no other concerns.  No fevers or chills.  ? ?Objective: ?General: No acute distress, AAOx3  ?DP/PT pulses palpable 2/4, CRT < 3 sec to all digits.  ?Protective sensation intact. Motor function intact.  ?Right foot: Incision is well coapted without any evidence of dehiscence and scar is formed.  There is still some mild swelling present.  There is no erythema or warmth.  She has good range of motion along the first MPJ.  There is no other areas of pinpoint tenderness.  The toes are in rectus position. ?No pain with calf compression, swelling, warmth, erythema.  ? ?Assessment and Plan:  ?Status post right foot bunionectomy ? ?-Treatment options discussed including all alternatives, risks, and complications ?-X-rays obtained reviewed.  Hardware intact with any complicating factors.  Status post bunionectomy.  No evidence of acute fracture. ?-Like for her to continue with supportive shoe gear but I will provide a note for work to limit her walking as well as no heavy lifting.  Continue to ice and elevate as well as compression of any postoperative edema.  Continue range of motion exercises. ? ?Trula Slade DPM ?

## 2021-10-13 ENCOUNTER — Telehealth: Payer: Self-pay | Admitting: Podiatry

## 2021-10-13 NOTE — Telephone Encounter (Signed)
Left vmail for opu ?

## 2021-10-21 ENCOUNTER — Ambulatory Visit (INDEPENDENT_AMBULATORY_CARE_PROVIDER_SITE_OTHER): Payer: BC Managed Care – PPO | Admitting: Podiatry

## 2021-10-21 DIAGNOSIS — M21619 Bunion of unspecified foot: Secondary | ICD-10-CM

## 2021-10-21 DIAGNOSIS — Z9889 Other specified postprocedural states: Secondary | ICD-10-CM

## 2021-10-24 NOTE — Progress Notes (Signed)
Subjective: ?[name] is a 51 y.o. is seen today in office s/p right foot bunionectomy preformed on 06/22/2021.  States that she has been doing better and she feels that she has made improvements in the last saw her.  She still gets some discomfort mostly to the joint area on the right side.  No recent injuries.  She is wearing regular shoes.  No fevers or chills.  No other concerns.   ? ?Objective: ?General: No acute distress, AAOx3  ?DP/PT pulses palpable 2/4, CRT < 3 sec to all digits.  ?Protective sensation intact. Motor function intact.  ?Right foot: Incision is well coapted without any evidence of dehiscence and scar is formed.  There is adequate range of motion of the first MPJ and there is no crepitation or restriction.  There is minimal edema still present.  Mild discomfort on the surgical site well in the metatarsal as opposed to the joint today.  Flexor, extensor tendons appear to be intact.  Toes in rectus position. ?No pain with calf compression, swelling, warmth, erythema.  ? ?Assessment and Plan:  ?Status post right foot bunionectomy ? ?-Treatment options discussed including all alternatives, risks, and complications ?-Overall clinically she is improving.  Swelling and pain is improved.  Discussed with gradual increase activity level as tolerated.  Continue ice, elevate as well as compression of any postoperative edema.  Continue with supportive shoe gear.  Continue range of motion exercises. ? ?Return in about 2 months (around 12/21/2021).  X-ray next appointment ? ?Trula Slade DPM ?

## 2021-12-26 ENCOUNTER — Ambulatory Visit: Payer: BC Managed Care – PPO | Admitting: Podiatry

## 2022-01-17 ENCOUNTER — Encounter: Payer: Self-pay | Admitting: Podiatry

## 2022-01-17 ENCOUNTER — Ambulatory Visit (INDEPENDENT_AMBULATORY_CARE_PROVIDER_SITE_OTHER): Payer: 59 | Admitting: Podiatry

## 2022-01-17 DIAGNOSIS — L600 Ingrowing nail: Secondary | ICD-10-CM

## 2022-01-17 NOTE — Patient Instructions (Signed)

## 2022-01-17 NOTE — Telephone Encounter (Signed)
Can you please call her for an appt? Thank you.

## 2022-01-20 NOTE — Progress Notes (Signed)
Subjective: 51 year old female presents the office today for concerns of ingrown toenails of the right big toe, medial aspect which has been ongoing for 1 week.  She sent a message this morning we got her into the office this morning as well but she also went to urgent care who went ahead and prescribed Keflex as well as mupirocin ointment.  Area is tender.  She is try to trim the nail herself.  Objective: AAO x3, NAD DP/PT pulses palpable bilaterally, CRT less than 3 seconds Incurvation present to right hallux toenail with localized edema there is no drainage or pus.  She is already trimmed out the corner of the nail.  There is no signs of abscess at this time.   There is still some mild residual edema on the surgical site but there is no pain or crepitation with imaging to motion.  No significant pain at surgical site. No pain with calf compression, swelling, warmth, erythema  Assessment: 51 year old female with ingrown toenail  Plan: -All treatment options discussed with the patient including all alternatives, risks, complications.  -I discussed the partial nail avulsion however she has an upcoming trip she is leaving for tomorrow and she wants to wait till after.  Discussed Epsom salt soaks.  Going to start the antibiotic as well as mupirocin ointment dressing changes daily.  Monitor for any signs or symptoms of infection. -Patient encouraged to call the office with any questions, concerns, change in symptoms.   Trula Slade DPM

## 2022-01-24 ENCOUNTER — Ambulatory Visit (INDEPENDENT_AMBULATORY_CARE_PROVIDER_SITE_OTHER): Payer: 59 | Admitting: Podiatry

## 2022-01-24 DIAGNOSIS — L600 Ingrowing nail: Secondary | ICD-10-CM

## 2022-01-24 NOTE — Patient Instructions (Signed)

## 2022-01-24 NOTE — Progress Notes (Signed)
Subjective:  Chief Complaint  Patient presents with   Ingrown Toenail    Pt is still having pain from a ingrown toenail on the right hallux lateral border    51 year old female with the above complaint presents for ongoing pain to the right hallux medial nail border. She states it is still sore and hard to wear closed in shoes. No drainage or pus. She wants to proceed having the corner removed.   Objective: AAO x3, NAD DP/PT pulses palpable bilaterally, CRT less than 3 seconds There is continuation of ingrown toenail the right medial nail border with localized edema there is no drainage or pus but there is tenderness palpation the nail border.  No open lesions.  No other areas of discomfort. No pain with calf compression, swelling, warmth, erythema  Assessment:  Plan: -All treatment options discussed with the patient including all alternatives, risks, complications.  -At this time, the patient is requesting partial nail removal with chemical matricectomy to the symptomatic portion of the nail. Risks and complications were discussed with the patient for which they understand and written consent was obtained. Under sterile conditions a total of 3 mL of a mixture of 2% lidocaine plain and 0.5% Marcaine plain was infiltrated in a hallux block fashion. Once anesthetized, the skin was prepped in sterile fashion. A tourniquet was then applied. Next the medial aspect of hallux nail border was then sharply excised making sure to remove the entire offending nail border. Once the nails were ensured to be removed area was debrided and the underlying skin was intact. There is no purulence identified in the procedure. Next phenol was then applied under standard conditions and copiously irrigated. Silvadene was applied. A dry sterile dressing was applied. After application of the dressing the tourniquet was removed and there is found to be an immediate capillary refill time to the digit. The patient tolerated  the procedure well any complications. Post procedure instructions were discussed the patient for which he verbally understood. Follow-up in one week for nail check or sooner if any problems are to arise. Discussed signs/symptoms of infection and directed to call the office immediately should any occur or go directly to the emergency room. In the meantime, encouraged to call the office with any questions, concerns, changes symptoms. -Patient encouraged to call the office with any questions, concerns, change in symptoms.   Trula Slade DPM

## 2022-02-07 ENCOUNTER — Ambulatory Visit: Payer: 59 | Admitting: Podiatry

## 2022-11-13 ENCOUNTER — Encounter (INDEPENDENT_AMBULATORY_CARE_PROVIDER_SITE_OTHER): Payer: Self-pay | Admitting: Family Medicine

## 2022-11-13 ENCOUNTER — Ambulatory Visit (INDEPENDENT_AMBULATORY_CARE_PROVIDER_SITE_OTHER): Payer: 59 | Admitting: Family Medicine

## 2022-11-13 VITALS — BP 121/83 | HR 70 | Temp 98.5°F | Ht 64.0 in | Wt 178.0 lb

## 2022-11-13 DIAGNOSIS — E785 Hyperlipidemia, unspecified: Secondary | ICD-10-CM | POA: Diagnosis not present

## 2022-11-13 DIAGNOSIS — Z6830 Body mass index (BMI) 30.0-30.9, adult: Secondary | ICD-10-CM

## 2022-11-13 DIAGNOSIS — E669 Obesity, unspecified: Secondary | ICD-10-CM | POA: Diagnosis not present

## 2022-11-13 DIAGNOSIS — Z0289 Encounter for other administrative examinations: Secondary | ICD-10-CM

## 2022-11-13 DIAGNOSIS — R635 Abnormal weight gain: Secondary | ICD-10-CM

## 2022-11-13 NOTE — Assessment & Plan Note (Signed)
Reviewed program information and anticipated results. She will return for a new patient visit, fasting with IC, labs and EKG

## 2022-11-13 NOTE — Assessment & Plan Note (Signed)
Lab Results  Component Value Date   CHOL 189 07/17/2008   HDL 73.2 07/17/2008   LDLCALC 107 (H) 07/17/2008   TRIG 45 07/17/2008   CHOLHDL 2.6 CALC 07/17/2008   No recent lipid panel to review.  She reports elevated cholesterol levels.  She is concerned about future health risks with weight gain. She has never been on cholesterol-lowering medication  Will obtain a fasting lipid panel next visit.

## 2022-11-13 NOTE — Assessment & Plan Note (Signed)
She reports a slow weight gain of about 20 pounds over the past 10 years.  She denies any increase in appetite or big changes in physical activity levels.  She has been through 4 pregnancies and struggled more with weight loss following her fourth pregnancy at age 52.  Will plan to obtain labs next visit looking for causes of weight gain.  Will obtain a indirect calorimetry looking at her metabolic rate and making recommendations for calorie intake and protein goals.

## 2022-11-13 NOTE — Progress Notes (Signed)
Office: [phone]  /  Fax: [phone]   Initial Visit  [name] was seen in clinic today to evaluate for obesity. She is interested in losing weight to improve overall health and reduce the risk of weight related complications. She presents today to review program treatment options, initial physical assessment, and evaluation.     She was referred by: self  When asked what else they would like to accomplish? She states: Improve energy levels and physical activity, Improve existing medical conditions, and Improve appearance  Weight history:  weight has been increasing slowly.  Her goal is 155 lb.  it has been 10 years since she was this weight  When asked how has your weight affected you? She states: Contributed to medical problems, Contributed to orthopedic problems or mobility issues, and Having fatigue  Some associated conditions: Hyperlipidemia  Contributing factors: Family history and Pregnancy  Weight promoting medications identified: None  Current nutrition plan: None  Current level of physical activity: [facility]  Current or previous pharmacotherapy: Phentermine; did compounded semaglutide- no weight loss  Response to medication: Lost weight initially but was unable to sustain weight loss   Past medical history includes:   Past Medical History:  Diagnosis Date   ADHD (attention deficit hyperactivity disorder)    AMA (advanced maternal age) multigravida 35+    Anxiety    Fibroid, uterine    GERD (gastroesophageal reflux disease)    Heart murmur mild no cardiologist    History of chicken pox    as child   history of palpitations managed by pcp no cardiologist no medications taken    palpitations   Hyperemesis gravidarum    migraines    Obesity    Osteo Arthritis both knees    Ovarian cyst    Pregnancy with history of pre-term labor    Sinusitis 06/08/2011   Use of cane as ambulatory aid 08/31/2021   Uterine  fibroid    Wears glasses 08/31/2021     Objective:   BP 121/83   Pulse 70   Temp 98.5 F (36.9 C)   Ht 5\' 4"  (1.626 m)   Wt 178 lb (80.7 kg)   SpO2 100%   BMI 30.55 kg/m  She was weighed on the bioimpedance scale: Body mass index is 30.55 kg/m.  Peak Weight:180 , Body Fat%:36.0, Visceral Fat Rating:8, Weight trend over the last 12 months: Increasing  General:  Alert, oriented and cooperative. Patient is in no acute distress.  Respiratory: Normal respiratory effort, no problems with respiration noted   Gait: able to ambulate independently  Mental Status: Normal mood and affect. Normal behavior. Normal judgment and thought content.   DIAGNOSTIC DATA REVIEWED:  BMET    Component Value Date/Time   NA 139 06/22/2019 2254   K 3.9 06/22/2019 2254   CL 107 06/22/2019 2254   CO2 26 06/22/2019 2254   GLUCOSE 97 06/22/2019 2254   BUN 19 06/22/2019 2254   CREATININE 0.81 06/22/2019 2254   CALCIUM 9.5 06/22/2019 2254   GFRNONAA >60 06/22/2019 2254   GFRAA >60 06/22/2019 2254   No results found for: "HGBA1C" No results found for: "INSULIN" CBC    Component Value Date/Time   WBC 5.5 09/02/2021 0955   RBC 4.31 09/02/2021 0955   HGB 13.8 09/02/2021 0955   HCT 40.4 09/02/2021 0955   PLT 324 09/02/2021 0955   MCV 93.7 09/02/2021 0955   MCH 32.0 09/02/2021 0955   MCHC 34.2  09/02/2021 0955   RDW 12.3 09/02/2021 0955   Iron/TIBC/Ferritin/ %Sat No results found for: "IRON", "TIBC", "FERRITIN", "IRONPCTSAT" Lipid Panel     Component Value Date/Time   CHOL 189 07/17/2008 0839   TRIG 45 07/17/2008 0839   HDL 73.2 07/17/2008 0839   CHOLHDL 2.6 CALC 07/17/2008 0839   VLDL 9 07/17/2008 0839   LDLCALC 107 (H) 07/17/2008 0839   Hepatic Function Panel     Component Value Date/Time   PROT 7.1 06/22/2019 2254   ALBUMIN 4.0 06/22/2019 2254   AST 16 06/22/2019 2254   ALT 16 06/22/2019 2254   ALKPHOS 61 06/22/2019 2254   BILITOT 0.4 06/22/2019 2254   BILIDIR 0.1 06/01/2010 1403    IBILI 0.5 06/01/2010 1403      Component Value Date/Time   TSH 1.10 07/17/2008 0839     Assessment and Plan:   Generalized obesity Assessment & Plan: Reviewed program information and anticipated results. She will return for a new patient visit, fasting with IC, labs and EKG   BMI 30.0-30.9,adult  Weight gain Assessment & Plan: She reports a slow weight gain of about 20 pounds over the past 10 years.  She denies any increase in appetite or big changes in physical activity levels.  She has been through 4 pregnancies and struggled more with weight loss following her fourth pregnancy at age 5.  Will plan to obtain labs next visit looking for causes of weight gain.  Will obtain a indirect calorimetry looking at her metabolic rate and making recommendations for calorie intake and protein goals.   Hyperlipidemia, unspecified hyperlipidemia type Assessment & Plan: Lab Results  Component Value Date   CHOL 189 07/17/2008   HDL 73.2 07/17/2008   LDLCALC 107 (H) 07/17/2008   TRIG 45 07/17/2008   CHOLHDL 2.6 CALC 07/17/2008   No recent lipid panel to review.  She reports elevated cholesterol levels.  She is concerned about future health risks with weight gain. She has never been on cholesterol-lowering medication  Will obtain a fasting lipid panel next visit.         Obesity Treatment / Action Plan:  Patient will work on garnering support from family and friends to begin weight loss journey. Will work on eliminating or reducing the presence of highly palatable, calorie dense foods in the home. Will complete provided nutritional and psychosocial assessment questionnaire before the next appointment. Will be scheduled for indirect calorimetry to determine resting energy expenditure in a fasting state.  This will allow [place] to create a reduced calorie, high-protein meal plan to promote loss of fat mass while preserving muscle mass. Will work on reading labels, making healthier  choices and watching portion sizes. Was counseled on nutritional approaches to weight loss and benefits of complex carbs and high quality protein as part of nutritional weight management. Was counseled on pharmacotherapy and role as an adjunct in weight management.   Obesity Education Performed Today:  She was weighed on the bioimpedance scale and results were discussed and documented in the synopsis.  We discussed obesity as a disease and the importance of a more detailed evaluation of all the factors contributing to the disease.  We discussed the importance of long term lifestyle changes which include nutrition, exercise and behavioral modifications as well as the importance of customizing this to her specific health and social needs.  We discussed the benefits of reaching a healthier weight to alleviate the symptoms of existing conditions and reduce the risks of the biomechanical, metabolic and  psychological effects of obesity.  [name] appears to be in the action stage of change and states they are ready to start intensive lifestyle modifications and behavioral modifications.  30 minutes was spent today on this visit including the above counseling, pre-visit chart review, and post-visit documentation.  Reviewed by clinician on day of visit: allergies, medications, problem list, medical history, surgical history, family history, social history, and previous encounter notes pertinent to obesity diagnosis.    Seymour Bars, D.O. DABFM, DABOM [facility] [phone] [place], [place] [address] [phone]

## 2022-12-06 ENCOUNTER — Ambulatory Visit (INDEPENDENT_AMBULATORY_CARE_PROVIDER_SITE_OTHER): Payer: 59 | Admitting: Family Medicine

## 2022-12-25 ENCOUNTER — Encounter (INDEPENDENT_AMBULATORY_CARE_PROVIDER_SITE_OTHER): Payer: Self-pay

## 2022-12-25 ENCOUNTER — Ambulatory Visit (INDEPENDENT_AMBULATORY_CARE_PROVIDER_SITE_OTHER): Payer: 59 | Admitting: Family Medicine

## 2023-01-08 ENCOUNTER — Ambulatory Visit (INDEPENDENT_AMBULATORY_CARE_PROVIDER_SITE_OTHER): Payer: 59 | Admitting: Family Medicine

## 2023-01-17 ENCOUNTER — Encounter (INDEPENDENT_AMBULATORY_CARE_PROVIDER_SITE_OTHER): Payer: Self-pay | Admitting: Family Medicine

## 2023-01-17 ENCOUNTER — Ambulatory Visit (INDEPENDENT_AMBULATORY_CARE_PROVIDER_SITE_OTHER): Payer: 59 | Admitting: Family Medicine

## 2023-01-17 VITALS — BP 125/85 | HR 68 | Temp 97.7°F | Ht 64.0 in | Wt 184.0 lb

## 2023-01-17 DIAGNOSIS — Z6831 Body mass index (BMI) 31.0-31.9, adult: Secondary | ICD-10-CM

## 2023-01-17 DIAGNOSIS — K219 Gastro-esophageal reflux disease without esophagitis: Secondary | ICD-10-CM | POA: Diagnosis not present

## 2023-01-17 DIAGNOSIS — R0602 Shortness of breath: Secondary | ICD-10-CM

## 2023-01-17 DIAGNOSIS — F3289 Other specified depressive episodes: Secondary | ICD-10-CM

## 2023-01-17 DIAGNOSIS — F908 Attention-deficit hyperactivity disorder, other type: Secondary | ICD-10-CM | POA: Diagnosis not present

## 2023-01-17 DIAGNOSIS — E669 Obesity, unspecified: Secondary | ICD-10-CM | POA: Diagnosis not present

## 2023-01-17 DIAGNOSIS — R5383 Other fatigue: Secondary | ICD-10-CM | POA: Diagnosis not present

## 2023-01-17 DIAGNOSIS — Z1331 Encounter for screening for depression: Secondary | ICD-10-CM

## 2023-01-17 DIAGNOSIS — F988 Other specified behavioral and emotional disorders with onset usually occurring in childhood and adolescence: Secondary | ICD-10-CM | POA: Insufficient documentation

## 2023-01-17 DIAGNOSIS — F32A Depression, unspecified: Secondary | ICD-10-CM

## 2023-01-18 LAB — CBC WITH DIFFERENTIAL/PLATELET
Basophils Absolute: 0.1 10*3/uL (ref 0.0–0.2)
Basos: 2 %
EOS (ABSOLUTE): 0.1 10*3/uL (ref 0.0–0.4)
Eos: 3 %
Hematocrit: 36.8 % (ref 34.0–46.6)
Hemoglobin: 12.4 g/dL (ref 11.1–15.9)
Immature Grans (Abs): 0 10*3/uL (ref 0.0–0.1)
Immature Granulocytes: 0 %
Lymphocytes Absolute: 1.9 10*3/uL (ref 0.7–3.1)
Lymphs: 38 %
MCH: 31.6 pg (ref 26.6–33.0)
MCHC: 33.7 g/dL (ref 31.5–35.7)
MCV: 94 fL (ref 79–97)
Monocytes Absolute: 0.4 10*3/uL (ref 0.1–0.9)
Monocytes: 8 %
Neutrophils Absolute: 2.6 10*3/uL (ref 1.4–7.0)
Neutrophils: 49 %
Platelets: 309 10*3/uL (ref 150–450)
RBC: 3.92 x10E6/uL (ref 3.77–5.28)
RDW: 12.2 % (ref 11.7–15.4)
WBC: 5.1 10*3/uL (ref 3.4–10.8)

## 2023-01-18 LAB — COMPREHENSIVE METABOLIC PANEL
ALT: 19 IU/L (ref 0–32)
AST: 23 IU/L (ref 0–40)
Albumin: 4.1 g/dL (ref 3.9–4.9)
Alkaline Phosphatase: 79 IU/L (ref 44–121)
BUN/Creatinine Ratio: 12 (ref 9–23)
BUN: 11 mg/dL (ref 6–24)
Bilirubin Total: 0.3 mg/dL (ref 0.0–1.2)
CO2: 21 mmol/L (ref 20–29)
Calcium: 9.2 mg/dL (ref 8.7–10.2)
Chloride: 103 mmol/L (ref 96–106)
Creatinine, Ser: 0.94 mg/dL (ref 0.57–1.00)
Globulin, Total: 2.9 g/dL (ref 1.5–4.5)
Glucose: 79 mg/dL (ref 70–99)
Potassium: 4.2 mmol/L (ref 3.5–5.2)
Sodium: 139 mmol/L (ref 134–144)
Total Protein: 7 g/dL (ref 6.0–8.5)
eGFR: 74 mL/min/{1.73_m2} (ref >59)

## 2023-01-18 LAB — TSH: TSH: 2.01 u[IU]/mL (ref 0.450–4.500)

## 2023-01-18 LAB — LIPID PANEL
Chol/HDL Ratio: 2.3 ratio (ref 0.0–4.4)
Cholesterol, Total: 217 mg/dL — ABNORMAL HIGH (ref 100–199)
HDL: 93 mg/dL (ref >39)
LDL Chol Calc (NIH): 111 mg/dL — ABNORMAL HIGH (ref 0–99)
Triglycerides: 72 mg/dL (ref 0–149)
VLDL Cholesterol Cal: 13 mg/dL (ref 5–40)

## 2023-01-18 LAB — FOLATE: Folate: 10.3 ng/mL (ref >3.0)

## 2023-01-18 LAB — T4, FREE: Free T4: 1.12 ng/dL (ref 0.82–1.77)

## 2023-01-18 LAB — VITAMIN D 25 HYDROXY (VIT D DEFICIENCY, FRACTURES): Vit D, 25-Hydroxy: 31.2 ng/mL (ref 30.0–100.0)

## 2023-01-18 LAB — HEMOGLOBIN A1C
Est. average glucose Bld gHb Est-mCnc: 108 mg/dL
Hgb A1c MFr Bld: 5.4 % (ref 4.8–5.6)

## 2023-01-18 LAB — INSULIN, RANDOM: INSULIN: 8.4 u[IU]/mL (ref 2.6–24.9)

## 2023-01-18 LAB — VITAMIN B12: Vitamin B-12: 1574 pg/mL — ABNORMAL HIGH (ref 232–1245)

## 2023-01-22 NOTE — Progress Notes (Signed)
Chief Complaint:   OBESITY [name] (MR# [id number]) is a 52 y.o. female who presents for evaluation and treatment of obesity and related comorbidities. Current BMI is Body mass index is 31.58 kg/m. [name] has been struggling with her weight for many years and has been unsuccessful in either losing weight, maintaining weight loss, or reaching her healthy weight goal.  [name] is currently in the action stage of change and ready to dedicate time achieving and maintaining a healthier weight. [name] is interested in becoming our patient and working on intensive lifestyle modifications including (but not limited to) diet and exercise for weight loss.  Patient works as a Research scientist (medical), some physical activity.  She lives with her husband and 3 kids.  Walks 30 minutes 2 times per week, and started water aerobics and tennis.  Planning meals and time are barriers.  Comfort eater.  [name]'s habits were reviewed today and are as follows: Her family eats meals together, she thinks her family will eat healthier with her, her desired weight loss is 34 lbs, she started gaining weight at age 89+, her heaviest weight ever was 178 pounds, she has significant food cravings issues, she snacks frequently in the evenings, she skips meals frequently, she is frequently drinking liquids with calories, she frequently makes poor food choices, she has problems with excessive hunger, and she struggles with emotional eating.  Depression Screen [name]'s Food and Mood (modified PHQ-9) score was 10.  Subjective:   1. Other fatigue [name] admits to daytime somnolence and denies waking up still tired. Patient has a history of symptoms of daytime fatigue and morning headache. Makeshia generally gets 7 hours of sleep per night, and states that she has generally restful sleep. Snoring is present. Apneic episodes are not present. Epworth Sleepiness Score is 6.  EKG-normal sinus rhythm without ischemia.  2. SOBOE (shortness of  Lee on exertion) [name] notes increasing shortness of Lee with exercising and seems to be worsening over time with weight gain. She notes getting out of Lee sooner with activity than she used to. This has not gotten worse recently. [name] denies shortness of Lee at rest or orthopnea.  3. Attention deficit hyperactivity disorder (ADHD), other type Patient is on Adderall XR 15 mg every morning.  Has some appetite suppression early in the day and tends to overeat at night.  4. Gastroesophageal reflux disease, unspecified whether esophagitis present Patient takes Nexium as needed.  Avoids highly acid foods and eating past 7:30 PM.  5. Other depression with emotional eating Patient's bariatric PHQ-9 score is 10.  She tends to snack more with stress.  She takes BuSpar 75 mg twice daily.  Assessment/Plan:   1. Other fatigue [name] does feel that her weight is causing her energy to be lower than it should be. Fatigue may be related to obesity, depression or many other causes. Labs will be ordered, and in the meanwhile, [name] will focus on self care including making healthy food choices, increasing physical activity and focusing on stress reduction.  - EKG 12-Lead - VITAMIN D 25 Hydroxy (Vit-D Deficiency, Fractures) - TSH - T4, free - Lipid panel - Insulin, random - Hemoglobin A1c - Folate - Comprehensive metabolic panel - Vitamin B12 - CBC with Differential/Platelet  2. SOBOE (shortness of Lee on exertion) [name] does feel that she gets out of Lee more easily that she used to when she exercises. [name] appears to be obesity related and exercise induced. She has agreed to  work on weight loss and gradually increase exercise to treat her exercise induced shortness of Lee. Will continue to monitor closely.  3. Attention deficit hyperactivity disorder (ADHD), other type Patient is to avoid use of stimulants.  4. Gastroesophageal reflux disease, unspecified  whether esophagitis present Patient is to continue behavior changes.  Look for improvements with weight loss.  5. Other depression with emotional eating Consider adding in cognitive behavioral therapy; work on stress reduction.  6. Depression screen [name] had a positive depression screening. Depression is commonly associated with obesity and often results in emotional eating behaviors. We will monitor this closely and work on CBT to help improve the non-hunger eating patterns. Referral to Psychology may be required if no improvement is seen as she continues in our clinic.  7. BMI 31.0-31.9,adult  8. Generalized obesity with starting BMI 31.6 [name] is currently in the action stage of change and her goal is to continue with weight loss efforts. I recommend [name] begin the structured treatment plan as follows:  She has agreed to the Category 2 Plan.  100-calorie snack list was given.  Exercise goals: All adults should avoid inactivity. Some physical activity is better than none, and adults who participate in any amount of physical activity gain some health benefits.   Behavioral modification strategies: increasing lean protein intake, increasing vegetables, increasing water intake, decreasing liquid calories, decreasing eating out, no skipping meals, meal planning and cooking strategies, keeping healthy foods in the home, better snacking choices, avoiding temptations, and planning for success.  She was informed of the importance of frequent follow-up visits to maximize her success with intensive lifestyle modifications for her multiple health conditions. She was informed we would discuss her lab results at her next visit unless there is a critical issue that needs to be addressed sooner. [name] agreed to keep her next visit at the agreed upon time to discuss these results.  Objective:   Blood pressure 125/85, pulse 68, temperature 97.7 F (36.5 C), height 5\' 4"  (1.626 m), weight 184 lb (83.5  kg), SpO2 90%. Body mass index is 31.58 kg/m.  EKG: Normal sinus rhythm, rate 63 BPM.  Indirect Calorimeter completed today shows a VO2 of 191 and a REE of 1310.  Her calculated basal metabolic rate is 4098 thus her basal metabolic rate is worse than expected.  General: Cooperative, alert, well developed, in no acute distress. HEENT: Conjunctivae and lids unremarkable. Cardiovascular: Regular rhythm.  Lungs: Normal work of breathing. Neurologic: No focal deficits.   Lab Results  Component Value Date   CREATININE 0.94 01/17/2023   BUN 11 01/17/2023   NA 139 01/17/2023   K 4.2 01/17/2023   CL 103 01/17/2023   CO2 21 01/17/2023   Lab Results  Component Value Date   ALT 19 01/17/2023   AST 23 01/17/2023   ALKPHOS 79 01/17/2023   BILITOT 0.3 01/17/2023   Lab Results  Component Value Date   HGBA1C 5.4 01/17/2023   Lab Results  Component Value Date   INSULIN 8.4 01/17/2023   Lab Results  Component Value Date   TSH 2.010 01/17/2023   Lab Results  Component Value Date   CHOL 217 (H) 01/17/2023   HDL 93 01/17/2023   LDLCALC 111 (H) 01/17/2023   TRIG 72 01/17/2023   CHOLHDL 2.3 01/17/2023   Lab Results  Component Value Date   WBC 5.1 01/17/2023   HGB 12.4 01/17/2023   HCT 36.8 01/17/2023   MCV 94 01/17/2023   PLT 309 01/17/2023  No results found for: "IRON", "TIBC", "FERRITIN"  Attestation Statements:   Reviewed by clinician on day of visit: allergies, medications, problem list, medical history, surgical history, family history, social history, and previous encounter notes.  Time spent on visit including pre-visit chart review and post-visit charting and care was 40 minutes.   Trude Mcburney, am acting as transcriptionist for Seymour Bars, DO.  I have reviewed the above documentation for accuracy and completeness, and I agree with the above. Seymour Bars, DO

## 2023-02-05 ENCOUNTER — Encounter (INDEPENDENT_AMBULATORY_CARE_PROVIDER_SITE_OTHER): Payer: Self-pay

## 2023-02-05 ENCOUNTER — Encounter (INDEPENDENT_AMBULATORY_CARE_PROVIDER_SITE_OTHER): Payer: Self-pay | Admitting: Family Medicine

## 2023-02-05 ENCOUNTER — Ambulatory Visit (INDEPENDENT_AMBULATORY_CARE_PROVIDER_SITE_OTHER): Payer: 59 | Admitting: Family Medicine

## 2023-02-05 VITALS — BP 130/74 | HR 78 | Temp 98.0°F | Ht 64.0 in | Wt 183.0 lb

## 2023-02-05 DIAGNOSIS — Z6831 Body mass index (BMI) 31.0-31.9, adult: Secondary | ICD-10-CM

## 2023-02-05 DIAGNOSIS — E668 Other obesity: Secondary | ICD-10-CM | POA: Diagnosis not present

## 2023-02-05 DIAGNOSIS — R632 Polyphagia: Secondary | ICD-10-CM | POA: Diagnosis not present

## 2023-02-05 DIAGNOSIS — R948 Abnormal results of function studies of other organs and systems: Secondary | ICD-10-CM

## 2023-02-05 DIAGNOSIS — E7800 Pure hypercholesterolemia, unspecified: Secondary | ICD-10-CM | POA: Diagnosis not present

## 2023-02-05 DIAGNOSIS — E669 Obesity, unspecified: Secondary | ICD-10-CM

## 2023-02-05 MED ORDER — QSYMIA 3.75-23 MG PO CP24: ORAL_CAPSULE | ORAL | 0 refills | Status: DC

## 2023-02-05 MED ORDER — QSYMIA 7.5-46 MG PO CP24: ORAL_CAPSULE | ORAL | 0 refills | Status: DC

## 2023-02-05 NOTE — Assessment & Plan Note (Signed)
Hunger on prescribed meal plan, worse after dinner She appears to be getting most of the food in on her plan- lacking protein at times and doing some fasting on her own.  We discussed a plan to add in more high protein snacks, hydrate well with water and add 1/4 plate of a high fiber carb to dinner given her normal fasting insulin and A1c.  She lacks insurance coverage for AOMs but she is a good candidate for use of Qsymia via mail order pharmacy.  Informed consent signed.  BP/ HR are WNL.  PDMP reviewed.  Will begin Qsymia in combination with prescribed meal plan + regular exercise.

## 2023-02-05 NOTE — Assessment & Plan Note (Addendum)
Lab Results  Component Value Date   CHOL 217 (H) 01/17/2023   HDL 93 01/17/2023   LDLCALC 111 (H) 01/17/2023   TRIG 72 01/17/2023   CHOLHDL 2.3 01/17/2023   Not on lipid lowering meds Reviewed labs  Continue prescribed dietary plan, low in saturated fat Repeat in 6 mos  The 10-year ASCVD risk score (Arnett DK, et al., 2019) is: 0.9%   Values used to calculate the score:     Age: 52 years     Sex: Female     Is Non-Hispanic African American: Yes     Diabetic: No     Tobacco smoker: No     Systolic Blood Pressure: 130 mmHg     Is BP treated: No     HDL Cholesterol: 93 mg/dL     Total Cholesterol: 217 mg/dL

## 2023-02-05 NOTE — Progress Notes (Signed)
Office: [phone]  /  Fax: [phone]  WEIGHT SUMMARY AND BIOMETRICS  Starting Date: 01/17/23  Starting Weight: 184lb   Weight Lost Since Last Visit: 1lb   Vitals Temp: 98 F (36.7 C) BP: 130/74 Pulse Rate: 78 SpO2: 100 %   Body Composition  Body Fat %: 36.4 % Fat Mass (lbs): 66.6 lbs Muscle Mass (lbs): 110.8 lbs Total Body Water (lbs): 72.8 lbs Visceral Fat Rating : 8     HPI  Chief Complaint: OBESITY  [name] is here to discuss her progress with her obesity treatment plan. She is on the the Category 2 Plan and states she is following her eating plan approximately 90 % of the time. She states she is exercising 30 minutes 5 times per week.   Interval History:  Since last office visit she is down 1 lb She has been on cat 2 meal plan Occasional meal skipping with the addition of  a protein shake She has been hungry after dinner, eating dinner around 5 pm She is picking healthy snacks off of her list She is walking or playing tennis for a total of 5 days/ wk She had used phentermine in the past with short term success She takes Adderall on occasion for ADHD  Pharmacotherapy: none  PHYSICAL EXAM:  Blood pressure 130/74, pulse 78, temperature 98 F (36.7 C), height 5\' 4"  (1.626 m), weight 183 lb (83 kg), SpO2 100%. Body mass index is 31.41 kg/m.  General: She is overweight, cooperative, alert, well developed, and in no acute distress. PSYCH: Has normal mood, affect and thought process.   Lungs: Normal breathing effort, no conversational dyspnea.   ASSESSMENT AND PLAN  TREATMENT PLAN FOR OBESITY:  Recommended Dietary Goals  [name] is currently in the action stage of change. As such, her goal is to continue weight management plan. She has agreed to the Category 2 Plan.  Behavioral Intervention  We discussed the following Behavioral Modification Strategies today: increasing lean protein intake, decreasing simple carbohydrates , increasing  vegetables, increasing lower glycemic fruits, increasing water intake, work on meal planning and preparation, keeping healthy foods at home, identifying sources and decreasing liquid calories, work on managing stress, creating time for self-care and relaxation measures, avoiding temptations and identifying enticing environmental cues, continue to practice mindfulness when eating, and planning for success.  Additional resources provided today: NA  Recommended Physical Activity Goals  [name] has been advised to work up to 150 minutes of moderate intensity aerobic activity a week and strengthening exercises 2-3 times per week for cardiovascular health, weight loss maintenance and preservation of muscle mass.   She has agreed to Increase the intensity, frequency or duration of strengthening exercises   Pharmacotherapy changes for the treatment of obesity: Qsymia   ASSOCIATED CONDITIONS ADDRESSED TODAY  Polyphagia Assessment & Plan: Hunger on prescribed meal plan, worse after dinner She appears to be getting most of the food in on her plan- lacking protein at times and doing some fasting on her own.  We discussed a plan to add in more high protein snacks, hydrate well with water and add 1/4 plate of a high fiber carb to dinner given her normal fasting insulin and A1c.  She lacks insurance coverage for AOMs but she is a good candidate for use of Qsymia via mail order pharmacy.  Informed consent signed.  BP/ HR are WNL.  PDMP reviewed.  Will begin Qsymia in combination with prescribed meal plan + regular exercise.  Orders: -  Qsymia; 1 capsule po qAM  Dispense: 14 capsule; Refill: 0 -     Qsymia; 1 capsule po qAM  Dispense: 30 capsule; Refill: 0  Generalized obesity with starting BMI 31  BMI 31.0-31.9,adult  Low basal metabolic rate Assessment & Plan: Her IC test done last visit showed a metabolic rate > 300 cal per day slower than expected.  She is fairly active but was getting in less  protein than needed.  She is to maintain cat 2 meal plan for caloric deficit with adequate calories, aiming for 90 g of dietary protein daily, > 150 min of exercise weekly and 8 hrs of sleep at night.  Repeat IC test in 6 mos   Pure hypercholesterolemia Assessment & Plan: Lab Results  Component Value Date   CHOL 217 (H) 01/17/2023   HDL 93 01/17/2023   LDLCALC 111 (H) 01/17/2023   TRIG 72 01/17/2023   CHOLHDL 2.3 01/17/2023   Not on lipid lowering meds Reviewed labs  Continue prescribed dietary plan, low in saturated fat Repeat in 6 mos  The 10-year ASCVD risk score (Arnett DK, et al., 2019) is: 0.9%   Values used to calculate the score:     Age: 52 years     Sex: Female     Is Non-Hispanic African American: Yes     Diabetic: No     Tobacco smoker: No     Systolic Blood Pressure: 130 mmHg     Is BP treated: No     HDL Cholesterol: 93 mg/dL     Total Cholesterol: 217 mg/dL        She was informed of the importance of frequent follow up visits to maximize her success with intensive lifestyle modifications for her multiple health conditions.   ATTESTASTION STATEMENTS:  Reviewed by clinician on day of visit: allergies, medications, problem list, medical history, surgical history, family history, social history, and previous encounter notes pertinent to obesity diagnosis.   I have personally spent 30 minutes total time today in preparation, patient care, nutritional counseling and documentation for this visit, including the following: review of clinical lab tests; review of medical tests/procedures/services.      Laurie Brink, DO DABFM, DABOM [facility] [address] [place], [place] [address] [phone]

## 2023-02-05 NOTE — Assessment & Plan Note (Signed)
Her IC test done last visit showed a metabolic rate > 300 cal per day slower than expected.  She is fairly active but was getting in less protein than needed.  She is to maintain cat 2 meal plan for caloric deficit with adequate calories, aiming for 90 g of dietary protein daily, > 150 min of exercise weekly and 8 hrs of sleep at night.  Repeat IC test in 6 mos

## 2023-02-21 ENCOUNTER — Encounter (INDEPENDENT_AMBULATORY_CARE_PROVIDER_SITE_OTHER): Payer: Self-pay | Admitting: Family Medicine

## 2023-02-21 ENCOUNTER — Ambulatory Visit (INDEPENDENT_AMBULATORY_CARE_PROVIDER_SITE_OTHER): Payer: 59 | Admitting: Family Medicine

## 2023-02-21 VITALS — BP 119/81 | HR 70 | Temp 98.4°F | Ht 64.0 in | Wt 182.0 lb

## 2023-02-21 DIAGNOSIS — E669 Obesity, unspecified: Secondary | ICD-10-CM | POA: Diagnosis not present

## 2023-02-21 DIAGNOSIS — R948 Abnormal results of function studies of other organs and systems: Secondary | ICD-10-CM

## 2023-02-21 DIAGNOSIS — R632 Polyphagia: Secondary | ICD-10-CM | POA: Diagnosis not present

## 2023-02-21 DIAGNOSIS — Z6831 Body mass index (BMI) 31.0-31.9, adult: Secondary | ICD-10-CM

## 2023-02-21 DIAGNOSIS — F19982 Other psychoactive substance use, unspecified with psychoactive substance-induced sleep disorder: Secondary | ICD-10-CM | POA: Diagnosis not present

## 2023-02-21 NOTE — Assessment & Plan Note (Signed)
Difficulty falling asleep at night, new This has worsened since starting Qsymia 3.75/23 mg each morning.  She is taking her medication early in the morning.  She has recently cut out coffee and other forms of caffeine.  She has also discontinued her Adderall.  Stress levels have been high as she has recently moved to of her kids into college.  Will monitor for worsening insomnia at next visit especially as she increases her dose of Qsymia 7.5/46 mg once daily.  Advised her to stay off caffeine and off of Adderall.

## 2023-02-21 NOTE — Assessment & Plan Note (Signed)
Patient was educated on her low metabolic rate last visit.  She has made more of a conscious effort to eat on a schedule including protein and with meals and snacks.  She is working to avoid meal skipping.  She has been more consistent with walking 5 days a week.  [facility]  We set a goal for weight training at the [facility] 2-3 times a week to help increase muscle mass which will also increase basal metabolic rate.

## 2023-02-21 NOTE — Assessment & Plan Note (Signed)
Has started Qsymia 3.75/23 mg once daily for polyphagia.  She is working on eating on a schedule, adequate lean protein and fiber with meals and regular exercise.  She has increased her water intake getting over 80 ounces daily.  She has had adverse side effects including poor sleep at night since starting Qsymia.  She has cut out caffeine and Adderall.  She will complete her Qsymia 3.75/23 mg once daily for total of 14 days then increase to 7.5/46 mg once daily with breakfast thereafter.  Blood pressure and heart rate are within normal limits.  Watch for worsening insomnia with dose increase.  Aim for 80 g or more of protein daily

## 2023-02-21 NOTE — Progress Notes (Signed)
Office: [phone]  /  Fax: [phone]  WEIGHT SUMMARY AND BIOMETRICS  Starting Date: 01/17/23  Starting Weight: 184lb   Weight Lost Since Last Visit: 1lb   Vitals Temp: 98.4 F (36.9 C) BP: 119/81 Pulse Rate: 70 SpO2: 100 %   Body Composition  Body Fat %: 37.4 % Fat Mass (lbs): 68.2 lbs Muscle Mass (lbs): 108.2 lbs Total Body Water (lbs): 74 lbs Visceral Fat Rating : 9     HPI  Chief Complaint: OBESITY  Iver is here to discuss her progress with her obesity treatment plan. She is on the the Category 2 Plan and states she is following her eating plan approximately 90 % of the time. She states she is exercising 60 minutes 5 times per week.   Interval History:  Since last office visit she is down 1 lb She is on Qsymia 3.75/23 mg each morning, started She has some trouble falling asleep at night She has cut back on caffeine intake She has a net weight loss of 2 lb in the past month of medically supervised weight management She has not been taking any Adderall  She is getting in fruits and veggies and doing fairly well with meal planning [facility]  Pharmacotherapy: Qsymia 3.75/23 mg qAM   PHYSICAL EXAM:  Blood pressure 119/81, pulse 70, temperature 98.4 F (36.9 C), height 5\' 4"  (1.626 m), weight 182 lb (82.6 kg), SpO2 100%. Body mass index is 31.24 kg/m.  General: She is healthy appearing, cooperative, alert, well developed, and in no acute distress. PSYCH: Has normal mood, affect and thought process.   Lungs: Normal breathing effort, no conversational dyspnea.   ASSESSMENT AND PLAN  TREATMENT PLAN FOR OBESITY:  Recommended Dietary Goals  Lashawanda is currently in the action stage of change. As such, her goal is to continue weight management plan. She has agreed to the Category 2 Plan.  Behavioral Intervention  We discussed the following Behavioral Modification Strategies today: increasing  lean protein intake, decreasing simple carbohydrates , increasing vegetables, increasing lower glycemic fruits, increasing fiber rich foods, increasing water intake, work on meal planning and preparation, keeping healthy foods at home, continue to work on implementation of reduced calorie nutritional plan, continue to practice mindfulness when eating, and planning for success.  Additional resources provided today: NA  Recommended Physical Activity Goals  Tennie has been advised to work up to 150 minutes of moderate intensity aerobic activity a week and strengthening exercises 2-3 times per week for cardiovascular health, weight loss maintenance and preservation of muscle mass.   She has agreed to [employer] strengthening exercises with a goal of 2-3 sessions a week  -Will focus gym workouts on weight training 2-3 times a week  Pharmacotherapy changes for the treatment of obesity: Qsymia increased to 7.5/46 mg each morning after the 14-day low-dose, has Rx  ASSOCIATED CONDITIONS ADDRESSED TODAY  Polyphagia Assessment & Plan: Has started Qsymia 3.75/23 mg once daily for polyphagia.  She is working on eating on a schedule, adequate lean protein and fiber with meals and regular exercise.  She has increased her water intake getting over 80 ounces daily.  She has had adverse side effects including poor sleep at night since starting Qsymia.  She has cut out caffeine and Adderall.  She will complete her Qsymia 3.75/23 mg once daily for total of 14 days then increase to 7.5/46 mg once daily with breakfast thereafter.  Blood pressure and heart rate are within normal limits.  Watch for worsening insomnia with dose increase.  Aim for 80 g or more of protein daily   Generalized obesity with starting BMI 31  BMI 31.0-31.9,adult  Drug-induced insomnia (HCC) Assessment & Plan: Difficulty falling asleep at night, new This has worsened since starting Qsymia 3.75/23 mg each morning.  She is taking her medication  early in the morning.  She has recently cut out coffee and other forms of caffeine.  She has also discontinued her Adderall.  Stress levels have been high as she has recently moved to of her kids into college.  Will monitor for worsening insomnia at next visit especially as she increases her dose of Qsymia 7.5/46 mg once daily.  Advised her to stay off caffeine and off of Adderall.   Low basal metabolic rate Assessment & Plan: Patient was educated on her low metabolic rate last visit.  She has made more of a conscious effort to eat on a schedule including protein and with meals and snacks.  She is working to avoid meal skipping.  She has been more consistent with walking 5 days a week.  She is going to the [facility]  We set a goal for weight training at the [facility] 2-3 times a week to help increase muscle mass which will also increase basal metabolic rate.       She was informed of the importance of frequent follow up visits to maximize her success with intensive lifestyle modifications for her multiple health conditions.   ATTESTASTION STATEMENTS:  Reviewed by clinician on day of visit: allergies, medications, problem list, medical history, surgical history, family history, social history, and previous encounter notes pertinent to obesity diagnosis.   I have personally spent 30 minutes total time today in preparation, patient care, nutritional counseling and documentation for this visit, including the following: review of clinical lab tests; review of medical tests/procedures/services.      Glennis Brink, DO DABFM, DABOM [facility] [address] [place], [place] [address] [phone]

## 2023-03-14 ENCOUNTER — Ambulatory Visit: Payer: BC Managed Care – PPO | Admitting: Family Medicine

## 2023-03-15 ENCOUNTER — Ambulatory Visit (INDEPENDENT_AMBULATORY_CARE_PROVIDER_SITE_OTHER): Payer: 59 | Admitting: Family Medicine

## 2023-03-15 ENCOUNTER — Encounter: Payer: Self-pay | Admitting: Family Medicine

## 2023-03-15 VITALS — BP 121/82 | HR 90 | Temp 98.4°F | Ht 64.0 in | Wt 175.0 lb

## 2023-03-15 DIAGNOSIS — J069 Acute upper respiratory infection, unspecified: Secondary | ICD-10-CM

## 2023-03-15 DIAGNOSIS — E669 Obesity, unspecified: Secondary | ICD-10-CM | POA: Diagnosis not present

## 2023-03-15 DIAGNOSIS — Z6830 Body mass index (BMI) 30.0-30.9, adult: Secondary | ICD-10-CM

## 2023-03-15 DIAGNOSIS — R948 Abnormal results of function studies of other organs and systems: Secondary | ICD-10-CM | POA: Diagnosis not present

## 2023-03-15 DIAGNOSIS — R632 Polyphagia: Secondary | ICD-10-CM

## 2023-03-15 MED ORDER — QSYMIA 7.5-46 MG PO CP24: ORAL_CAPSULE | ORAL | 0 refills | Status: AC

## 2023-03-15 NOTE — Assessment & Plan Note (Signed)
Patient is currently sick with a URI.  She has nasal congestion and malaise.  She is wearing a mask during her visit today.  She has needed to take over-the-counter cold medication.  She has had a reduction of appetite while sick and has paused her workouts.  Recommend discontinuing Qsymia while taking over-the-counter cold medications.  She may resume Qsymia once she is off cold medication.  Recommend hydrating well with fluids, bland diet, rest.  Resume exercise once feeling better.  Contact PCP if feeling worse.

## 2023-03-15 NOTE — Assessment & Plan Note (Signed)
She paused her Adderall while on Qsymia.  She has been needing to take an over-the-counter cold medication over the past several days.Polyphasia has improved on Qsymia 7.5/46 mg once daily.  She has seen 9 pounds of weight loss in the past 8 weeks of medically supervised weight management.  Blood pressure, heart rate are within normal limits  Once feeling better, will refocus on getting in 80+ grams of dietary protein intake daily. Hydrate well with water Continue Qsymia 7.5/46 mg every morning.  Will pause while on OTC cold medications

## 2023-03-15 NOTE — Assessment & Plan Note (Signed)
According to patient's indirect calorimetry done at her initial visit in July, she had a low metabolic rate at 2841 cal/day.  She is doing well on category 2 meal plan.  She feels adequate satiety.  She is working on ways to increase her metabolic rate including sleep, protein intake and regular exercise.  Will plan to recheck metabolic rate in the next 6 months

## 2023-03-15 NOTE — Progress Notes (Signed)
Office: [phone]  /  Fax: [phone]  WEIGHT SUMMARY AND BIOMETRICS  Starting Date: 01/17/23  Starting Weight: 184lb   Weight Lost Since Last Visit: 7lb   Vitals Temp: 98.4 F (36.9 C) BP: 121/82 Pulse Rate: 90 SpO2: 98 %   Body Composition  Body Fat %: 35.4 % Fat Mass (lbs): 62 lbs Muscle Mass (lbs): 107.6 lbs Total Body Water (lbs): 69.2 lbs Visceral Fat Rating : 8   HPI  Chief Complaint: OBESITY  [name] is here to discuss her progress with her obesity treatment plan. She is on the the Category 2 Plan and states she is following her eating plan approximately 80 % of the time. She states she is exercising 30-60 minutes 4 times per week.   Interval History:  Since last office visit she is down 7 lb in the past 3 weeks She has a net weight loss of 9 lb in 8 weeks of medically supervised weight management She currently has a cold with not much of an appetite She has paused her Adderall She had been working out before her cold started She went out of town last weekend She has had improvement in appetite while on Qsymia 7.5/46 mg once daily  Pharmacotherapy: Qsymia 7.5/46 mg every morning  PHYSICAL EXAM:  Blood pressure 121/82, pulse 90, temperature 98.4 F (36.9 C), height 5\' 4"  (1.626 m), weight 175 lb (79.4 kg), SpO2 98%. Body mass index is 30.04 kg/m.  General: She is overweight, cooperative, alert, well developed, wearing a mask PSYCH: Has normal mood, affect and thought process.   Lungs: Normal breathing effort, no conversational dyspnea. HEENT: Nasal congestion with cough   ASSESSMENT AND PLAN  TREATMENT PLAN FOR OBESITY:  Recommended Dietary Goals  Elrita is currently in the action stage of change. As such, her goal is to continue weight management plan. She has agreed to the Category 2 Plan. -While feeling poorly, recommend hydrating well with clear fluids, avoiding high sugar items.  Limit intake of dairy.  She may have broth and a bland  diet  Behavioral Intervention  We discussed the following Behavioral Modification Strategies today: increasing lean protein intake, decreasing simple carbohydrates , increasing vegetables, increasing lower glycemic fruits, increasing water intake, work on meal planning and preparation, work on managing stress, creating time for self-care and relaxation measures, continue to practice mindfulness when eating, and planning for success.  Additional resources provided today: NA  Recommended Physical Activity Goals  Dakyla has been advised to work up to 150 minutes of moderate intensity aerobic activity a week and strengthening exercises 2-3 times per week for cardiovascular health, weight loss maintenance and preservation of muscle mass.   She has agreed to Think about ways to increase daily physical activity and overcoming barriers to exercise -Once feeling better, she may resume gym workouts 3 times a week  Pharmacotherapy changes for the treatment of obesity: None  ASSOCIATED CONDITIONS ADDRESSED TODAY  Polyphagia Assessment & Plan: She paused her Adderall while on Qsymia.  She has been needing to take an over-the-counter cold medication over the past several days.Polyphasia has improved on Qsymia 7.5/46 mg once daily.  She has seen 9 pounds of weight loss in the past 8 weeks of medically supervised weight management.  Blood pressure, heart rate are within normal limits  Once feeling better, will refocus on getting in 80+ grams of dietary protein intake daily. Hydrate well with water Continue Qsymia 7.5/46 mg every morning.  Will pause while on OTC cold medications  Orders: -     Qsymia; 1 capsule po qAM  Dispense: 30 capsule; Refill: 0  Generalized obesity with starting BMI 31  BMI 30.0-30.9,adult  Low basal metabolic rate Assessment & Plan: According to patient's indirect calorimetry done at her initial visit in July, she had a low metabolic rate at 0981 cal/day.  She is doing well  on category 2 meal plan.  She feels adequate satiety.  She is working on ways to increase her metabolic rate including sleep, protein intake and regular exercise.  Will plan to recheck metabolic rate in the next 6 months   Upper respiratory tract infection, unspecified type Assessment & Plan: Patient is currently sick with a URI.  She has nasal congestion and malaise.  She is wearing a mask during her visit today.  She has needed to take over-the-counter cold medication.  She has had a reduction of appetite while sick and has paused her workouts.  Recommend discontinuing Qsymia while taking over-the-counter cold medications.  She may resume Qsymia once she is off cold medication.  Recommend hydrating well with fluids, bland diet, rest.  Resume exercise once feeling better.  Contact PCP if feeling worse.       She was informed of the importance of frequent follow up visits to maximize her success with intensive lifestyle modifications for her multiple health conditions.   ATTESTASTION STATEMENTS:  Reviewed by clinician on day of visit: allergies, medications, problem list, medical history, surgical history, family history, social history, and previous encounter notes pertinent to obesity diagnosis.   I have personally spent 30 minutes total time today in preparation, patient care, nutritional counseling and documentation for this visit, including the following: review of clinical lab tests; review of medical tests/procedures/services.      Glennis Brink, DO DABFM, DABOM [facility] [address] [place], [place] [address] [phone]

## 2023-04-11 ENCOUNTER — Ambulatory Visit: Payer: 59 | Admitting: Family Medicine

## 2024-04-22 ENCOUNTER — Other Ambulatory Visit (HOSPITAL_BASED_OUTPATIENT_CLINIC_OR_DEPARTMENT_OTHER): Payer: Self-pay

## 2024-04-22 MED ORDER — SHINGRIX 50 MCG/0.5ML IM SUSR
INTRAMUSCULAR | 1 refills | Status: AC
  Filled 2024-04-22: qty 0.5, 1d supply, fill #0

## 2024-06-23 ENCOUNTER — Other Ambulatory Visit (HOSPITAL_BASED_OUTPATIENT_CLINIC_OR_DEPARTMENT_OTHER): Payer: Self-pay

## 2024-06-23 MED ORDER — SHINGRIX 50 MCG/0.5ML IM SUSR
INTRAMUSCULAR | 0 refills | Status: AC
  Filled 2024-06-23: qty 1, 1d supply, fill #0
